# Patient Record
Sex: Female | Born: 1975 | Race: White | Hispanic: Yes | Marital: Married | Smoking: Never smoker
Health system: Southern US, Community
[De-identification: ages and names within clinical notes are randomized; demographics above are authoritative.]

## PROBLEM LIST (undated history)

## (undated) DIAGNOSIS — F32A Depression, unspecified: Secondary | ICD-10-CM

## (undated) DIAGNOSIS — F329 Major depressive disorder, single episode, unspecified: Secondary | ICD-10-CM

## (undated) DIAGNOSIS — E785 Hyperlipidemia, unspecified: Secondary | ICD-10-CM

## (undated) DIAGNOSIS — A048 Other specified bacterial intestinal infections: Secondary | ICD-10-CM

## (undated) DIAGNOSIS — I10 Essential (primary) hypertension: Secondary | ICD-10-CM

## (undated) HISTORY — DX: Major depressive disorder, single episode, unspecified: F32.9

## (undated) HISTORY — DX: Essential (primary) hypertension: I10

## (undated) HISTORY — DX: Depression, unspecified: F32.A

## (undated) HISTORY — DX: Morbid (severe) obesity due to excess calories: E66.01

## (undated) HISTORY — DX: Other specified bacterial intestinal infections: A04.8

## (undated) HISTORY — DX: Hyperlipidemia, unspecified: E78.5

---

## 2002-06-02 ENCOUNTER — Other Ambulatory Visit: Admission: RE | Admit: 2002-06-02 | Discharge: 2002-06-02 | Payer: Self-pay | Admitting: Obstetrics and Gynecology

## 2002-12-22 ENCOUNTER — Inpatient Hospital Stay (HOSPITAL_COMMUNITY): Admission: AD | Admit: 2002-12-22 | Discharge: 2002-12-26 | Payer: Self-pay | Admitting: Obstetrics and Gynecology

## 2002-12-23 ENCOUNTER — Encounter (INDEPENDENT_AMBULATORY_CARE_PROVIDER_SITE_OTHER): Payer: Self-pay | Admitting: Specialist

## 2006-09-10 DIAGNOSIS — O009 Unspecified ectopic pregnancy without intrauterine pregnancy: Secondary | ICD-10-CM

## 2006-09-20 ENCOUNTER — Inpatient Hospital Stay (HOSPITAL_COMMUNITY): Admission: AD | Admit: 2006-09-20 | Discharge: 2006-09-20 | Payer: Self-pay | Admitting: Obstetrics & Gynecology

## 2006-09-23 ENCOUNTER — Inpatient Hospital Stay (HOSPITAL_COMMUNITY): Admission: AD | Admit: 2006-09-23 | Discharge: 2006-09-23 | Payer: Self-pay | Admitting: Obstetrics & Gynecology

## 2006-09-26 ENCOUNTER — Inpatient Hospital Stay (HOSPITAL_COMMUNITY): Admission: AD | Admit: 2006-09-26 | Discharge: 2006-09-26 | Payer: Self-pay | Admitting: Obstetrics & Gynecology

## 2007-10-23 ENCOUNTER — Other Ambulatory Visit: Admission: RE | Admit: 2007-10-23 | Discharge: 2007-10-23 | Payer: Self-pay | Admitting: Internal Medicine

## 2007-10-23 ENCOUNTER — Encounter: Payer: Self-pay | Admitting: Internal Medicine

## 2007-10-23 ENCOUNTER — Ambulatory Visit: Payer: Self-pay | Admitting: Internal Medicine

## 2007-10-23 DIAGNOSIS — N926 Irregular menstruation, unspecified: Secondary | ICD-10-CM

## 2007-10-24 LAB — CONVERTED CEMR LAB
ALT: 15 units/L (ref 0–35)
Calcium: 9.4 mg/dL (ref 8.4–10.5)
Chloride: 106 meq/L (ref 96–112)
Cholesterol: 202 mg/dL (ref 0–200)
Creatinine, Ser: 0.5 mg/dL (ref 0.4–1.2)
Direct LDL: 133.7 mg/dL
Eosinophils Absolute: 0.2 10*3/uL (ref 0.0–0.6)
Eosinophils Relative: 2.1 % (ref 0.0–5.0)
GFR calc non Af Amer: 153 mL/min
Glucose, Bld: 88 mg/dL (ref 70–99)
HCT: 38.4 % (ref 36.0–46.0)
Iron: 55 ug/dL (ref 42–145)
MCV: 87.6 fL (ref 78.0–100.0)
Neutrophils Relative %: 65.6 % (ref 43.0–77.0)
Pap Smear: NORMAL
Platelets: 304 10*3/uL (ref 150–400)
RBC: 4.38 M/uL (ref 3.87–5.11)
RDW: 12 % (ref 11.5–14.6)
WBC: 7.2 10*3/uL (ref 4.5–10.5)

## 2007-10-31 ENCOUNTER — Encounter (INDEPENDENT_AMBULATORY_CARE_PROVIDER_SITE_OTHER): Payer: Self-pay | Admitting: *Deleted

## 2008-12-31 ENCOUNTER — Encounter: Payer: Self-pay | Admitting: Gynecology

## 2008-12-31 ENCOUNTER — Other Ambulatory Visit: Admission: RE | Admit: 2008-12-31 | Discharge: 2008-12-31 | Payer: Self-pay | Admitting: Internal Medicine

## 2008-12-31 ENCOUNTER — Ambulatory Visit: Payer: Self-pay | Admitting: Gynecology

## 2010-01-24 ENCOUNTER — Other Ambulatory Visit: Admission: RE | Admit: 2010-01-24 | Discharge: 2010-01-24 | Payer: Self-pay | Admitting: Gynecology

## 2010-01-24 ENCOUNTER — Ambulatory Visit: Payer: Self-pay | Admitting: Gynecology

## 2010-01-27 ENCOUNTER — Ambulatory Visit: Payer: Self-pay | Admitting: Gynecology

## 2010-01-31 ENCOUNTER — Ambulatory Visit (HOSPITAL_COMMUNITY): Admission: RE | Admit: 2010-01-31 | Discharge: 2010-01-31 | Payer: Self-pay | Admitting: Gynecology

## 2010-01-31 ENCOUNTER — Ambulatory Visit: Payer: Self-pay | Admitting: Gynecology

## 2010-02-03 ENCOUNTER — Inpatient Hospital Stay (HOSPITAL_COMMUNITY): Admission: AD | Admit: 2010-02-03 | Discharge: 2010-02-03 | Payer: Self-pay | Admitting: Gynecology

## 2010-02-06 ENCOUNTER — Inpatient Hospital Stay (HOSPITAL_COMMUNITY): Admission: AD | Admit: 2010-02-06 | Discharge: 2010-02-06 | Payer: Self-pay | Admitting: Gynecology

## 2010-02-07 ENCOUNTER — Ambulatory Visit: Payer: Self-pay | Admitting: Gynecology

## 2010-02-21 ENCOUNTER — Ambulatory Visit: Payer: Self-pay | Admitting: Gynecology

## 2010-02-28 ENCOUNTER — Ambulatory Visit: Payer: Self-pay | Admitting: Gynecology

## 2010-03-07 ENCOUNTER — Ambulatory Visit: Payer: Self-pay | Admitting: Gynecology

## 2010-12-11 DIAGNOSIS — I10 Essential (primary) hypertension: Secondary | ICD-10-CM

## 2010-12-11 HISTORY — DX: Essential (primary) hypertension: I10

## 2011-01-01 ENCOUNTER — Encounter: Payer: Self-pay | Admitting: Gynecology

## 2011-01-27 ENCOUNTER — Encounter (INDEPENDENT_AMBULATORY_CARE_PROVIDER_SITE_OTHER): Payer: 59 | Admitting: Gynecology

## 2011-01-27 ENCOUNTER — Other Ambulatory Visit (HOSPITAL_COMMUNITY)
Admission: RE | Admit: 2011-01-27 | Discharge: 2011-01-27 | Disposition: A | Discharge status: Home / Self Care | Payer: 59 | Source: Ambulatory Visit | Attending: Gynecology | Admitting: Gynecology

## 2011-01-27 ENCOUNTER — Other Ambulatory Visit: Payer: Self-pay | Admitting: Gynecology

## 2011-01-27 DIAGNOSIS — Z124 Encounter for screening for malignant neoplasm of cervix: Secondary | ICD-10-CM | POA: Insufficient documentation

## 2011-01-27 DIAGNOSIS — B373 Candidiasis of vulva and vagina: Secondary | ICD-10-CM

## 2011-01-27 DIAGNOSIS — Z13220 Encounter for screening for lipoid disorders: Secondary | ICD-10-CM

## 2011-01-27 DIAGNOSIS — Z833 Family history of diabetes mellitus: Secondary | ICD-10-CM

## 2011-01-27 DIAGNOSIS — R635 Abnormal weight gain: Secondary | ICD-10-CM

## 2011-01-27 DIAGNOSIS — Z01419 Encounter for gynecological examination (general) (routine) without abnormal findings: Secondary | ICD-10-CM

## 2011-02-03 ENCOUNTER — Other Ambulatory Visit (INDEPENDENT_AMBULATORY_CARE_PROVIDER_SITE_OTHER): Payer: 59

## 2011-02-03 DIAGNOSIS — E7800 Pure hypercholesterolemia, unspecified: Secondary | ICD-10-CM

## 2011-03-01 LAB — DIFFERENTIAL
Basophils Absolute: 0 10*3/uL (ref 0.0–0.1)
Basophils Relative: 0 % (ref 0–1)
Eosinophils Absolute: 0.1 10*3/uL (ref 0.0–0.7)
Lymphs Abs: 2.1 10*3/uL (ref 0.7–4.0)
Monocytes Absolute: 0.4 10*3/uL (ref 0.1–1.0)
Monocytes Relative: 4 % (ref 3–12)
Monocytes Relative: 5 % (ref 3–12)
Neutro Abs: 6.6 10*3/uL (ref 1.7–7.7)
Neutrophils Relative %: 58 % (ref 43–77)
Neutrophils Relative %: 71 % (ref 43–77)

## 2011-03-01 LAB — HCG, QUANTITATIVE, PREGNANCY
hCG, Beta Chain, Quant, S: 4198 m[IU]/mL — ABNORMAL HIGH (ref <5)
hCG, Beta Chain, Quant, S: 5638 m[IU]/mL — ABNORMAL HIGH (ref <5)

## 2011-03-01 LAB — CBC
HCT: 40.2 % (ref 36.0–46.0)
MCHC: 33.7 g/dL (ref 30.0–36.0)
MCV: 89.3 fL (ref 78.0–100.0)
MCV: 89.4 fL (ref 78.0–100.0)
Platelets: 264 10*3/uL (ref 150–400)
RBC: 4.5 MIL/uL (ref 3.87–5.11)
WBC: 5.4 10*3/uL (ref 4.0–10.5)
WBC: 9.2 10*3/uL (ref 4.0–10.5)

## 2011-03-01 LAB — CREATININE, SERUM
Creatinine, Ser: 0.47 mg/dL (ref 0.4–1.2)
GFR calc Af Amer: >60 mL/min (ref >60)
GFR calc non Af Amer: >60 mL/min (ref >60)

## 2011-03-01 LAB — AST: AST: 12 U/L (ref 0–37)

## 2011-03-01 LAB — BUN: BUN: 7 mg/dL (ref 6–23)

## 2011-03-02 ENCOUNTER — Other Ambulatory Visit: Payer: 59

## 2011-04-28 NOTE — Discharge Summary (Signed)
   Crystal Colon, LINGELBACH NO.:  [id number]   MEDICAL RECORD NO.:  [id number]                   PATIENT TYPE:  INP   LOCATION:  9127                                 [facility]   PHYSICIAN:  Conni Elliot, M.D.             DATE OF BIRTH:  1976-05-22   DATE OF ADMISSION:  12/22/2002  DATE OF DISCHARGE:  12/26/2002                                 DISCHARGE SUMMARY   DISCHARGE DIAGNOSES:  Repeat low transverse cesarean section of a viable  female infant.   DISCHARGE MEDICATIONS:  1. Percocet 5/325 mg one to two tablets p.o. q.4h. p.r.n. pain.  2. Ferrous sulfate 325 mg one tablet p.o. daily.  3. Prenatal vitamins one tablet p.o. daily.   BRIEF ADMISSION HISTORY:  This is a 35 year old G2, P1, now G2, P2 who  presented at 9 and 4/[redacted] weeks gestation with spontaneous rupture of  membranes and wishes to pursue VBAC.  She had had a scheduled cesarean  section at 40 weeks for her first gestation secondary to breech  presentation.  Upon admission she was found to have rupture of membranes,  then was consequently admitted to L&D.  Expectant management was performed  __________.  However, secondary to slow progression, low dose Pitocin and an  IUPC was started on her.  During labor she did spike a temperature of 100.8  and consequently was started on ampicillin and gentamicin for  chorioamnionitis.  The patient's labor progressed until she was an anterior  lip, 100% effaced, and +1 to +2 station.  However, she remained anterior lip  for over an hour and the fetal heart tracing became nonreassuring.  She had  decreased short-term variability and was having repetitive late  decelerations.  Consequently, at that point in time she was taken to the OR  for a repeat LTCS.  At that time she delivered a viable female infant with  Apgars of 4 at one minute and 9 at five minutes.  She then had routine  postpartum care and was discharged on postoperative day number three  with  good incisional healing.   DISCHARGE ACTIVITIES:  The patient was advised to pelvic rest consistent of  no sexual activity for six weeks.   FOLLOW UP:  She was advised to follow up at [facility] in six weeks for  which an IUD will be placed.   DISCHARGE LABORATORIES:  Her hemoglobin was 9.1 and hematocrit was 26.5 on  December 24, 2002.  Newborn information and circumcision was performed on the  patient's child on December 25, 2002.     Rodolph Bong, M.D.                        Conni Elliot, M.D.    AK/MEDQ  D:  12/26/2002  T:  12/26/2002  Job:  [id number]

## 2011-04-28 NOTE — Op Note (Signed)
   [name], Colon NO.:  [id number]   MEDICAL RECORD NO.:  [id number]                   PATIENT TYPE:  INP   LOCATION:  9127                                 [facility]   PHYSICIAN:  Conni Elliot, M.D.             DATE OF BIRTH:  19-Apr-1976   DATE OF PROCEDURE:  12/23/2002  DATE OF DISCHARGE:  12/26/2002                                 OPERATIVE REPORT   PREOPERATIVE DIAGNOSIS:  1. Repetitive late decelerations and decreased short term variability.  2. Prior cesarean delivery.  3. Persistent occipitoposterior position.   POSTOPERATIVE DIAGNOSES:  1. Repetitive late decelerations and decreased short term variability.  2. Prior cesarean delivery.  3. Persistent occipitoposterior position.   PROCEDURE:  Low transverse cesarean section.   SURGEON:  Conni Elliot, M.D.   OPERATIVE FINDINGS:  7 pound 14 ounce female with Apgars of 4 and 9, unable to  obtain adequate umbilical vein gas.  The placenta was sent to pathology.   DESCRIPTION OF PROCEDURE:  The patient was taken to the operating room and  placed supine in the left lateral tilt position and receiving oxygen.  She  was prepped and draped in the sterile fashion.  A low transverse incision  was made.  Incision was extended through the skin and subcutaneous fascia.  The rectus muscles were opened in the midline.  I created a bladder flap and  a low posterior incision was made.  The baby was delivered in the vertex  presentation.  Cord was doubly clamped and cut.  The baby was handed to the  neonatologist in attendance.  The placenta was delivered spontaneously.  The  uterus, bladder flap, anterior peritoneum, fascia and skin were closed in  the routine fashion.  Estimated blood loss was less than 800 mL.  The  needle, sponge and instrument counts were correct.   ANESTHESIA:   INDICATIONS:   DESCRIPTION OF PROCEDURE:                                               Conni Elliot, M.D.    ASG/MEDQ  D:  02/14/2003  T:  02/14/2003  Job:  [id number]

## 2012-01-29 ENCOUNTER — Encounter: Payer: Self-pay | Admitting: Gynecology

## 2012-01-29 ENCOUNTER — Ambulatory Visit (INDEPENDENT_AMBULATORY_CARE_PROVIDER_SITE_OTHER): Payer: 59 | Admitting: Gynecology

## 2012-01-29 VITALS — BP 122/80 | Ht 62.0 in | Wt 185.0 lb

## 2012-01-29 DIAGNOSIS — R635 Abnormal weight gain: Secondary | ICD-10-CM

## 2012-01-29 DIAGNOSIS — Z01419 Encounter for gynecological examination (general) (routine) without abnormal findings: Secondary | ICD-10-CM

## 2012-01-29 DIAGNOSIS — G8929 Other chronic pain: Secondary | ICD-10-CM | POA: Insufficient documentation

## 2012-01-29 DIAGNOSIS — R1013 Epigastric pain: Secondary | ICD-10-CM

## 2012-01-29 DIAGNOSIS — I10 Essential (primary) hypertension: Secondary | ICD-10-CM | POA: Insufficient documentation

## 2012-01-29 LAB — COMPREHENSIVE METABOLIC PANEL
AST: 14 U/L (ref 0–37)
Alkaline Phosphatase: 60 U/L (ref 39–117)
BUN: 11 mg/dL (ref 6–23)
Calcium: 8.8 mg/dL (ref 8.4–10.5)
Creat: 0.62 mg/dL (ref 0.50–1.10)
Total Bilirubin: 0.7 mg/dL (ref 0.3–1.2)

## 2012-01-29 LAB — LIPID PANEL
HDL: 58 mg/dL (ref >39)
LDL Cholesterol: 115 mg/dL — ABNORMAL HIGH (ref 0–99)
Total CHOL/HDL Ratio: 3.4 Ratio
VLDL: 27 mg/dL (ref 0–40)

## 2012-01-29 LAB — CBC WITH DIFFERENTIAL/PLATELET
Basophils Absolute: 0.1 10*3/uL (ref 0.0–0.1)
Basophils Relative: 1 % (ref 0–1)
Hemoglobin: 13.9 g/dL (ref 12.0–15.0)
MCHC: 32.6 g/dL (ref 30.0–36.0)
Monocytes Relative: 6 % (ref 3–12)
Neutro Abs: 2.9 10*3/uL (ref 1.7–7.7)
Neutrophils Relative %: 55 % (ref 43–77)
RDW: 13.7 % (ref 11.5–15.5)

## 2012-01-29 LAB — URINALYSIS W MICROSCOPIC + REFLEX CULTURE
Casts: NONE SEEN
Ketones, ur: NEGATIVE mg/dL
Nitrite: NEGATIVE
pH: 5.5 (ref 5.0–8.0)

## 2012-01-29 LAB — TSH: TSH: 0.709 u[IU]/mL (ref 0.350–4.500)

## 2012-01-29 MED ORDER — LISINOPRIL 10 MG PO TABS: 20.0000 mg | ORAL_TABLET | Freq: Every day | ORAL | Status: DC

## 2012-01-29 NOTE — Patient Instructions (Addendum)
Control del colesterol  Los niveles de colesterol en el organismo estn determinados significativamente por su dieta. Los niveles de colesterol tambin se relacionan con la enfermedad cardaca. El material que sigue ayuda a Software engineer relacin y a Chiropractor qu puede hacer para mantener su corazn sano. No todo el colesterol es [place]. Las lipoprotenas de baja densidad (LDL) forman el colesterol "malo". El colesterol malo puede ocasionar depsitos de grasa que se acumulan en el interior de las arterias. Las lipoprotenas de alta densidad (HDL) es el colesterol "bueno". Ayuda a remover el colesterol LDL "malo" de la [place]. El colesterol es un factor de riesgo muy importante para la enfermedad cardaca. Otros factores de riesgo son la hipertensin arterial, el hbito de fumar, el estrs, la herencia y [place].  El msculo cardaco obtiene el suministro de sangre a travs de las arterias coronarias. Si su colesterol LDL ("malo") est elevado y el HDL ("bueno") es bajo, tiene un factor de riesgo para que se formen depsitos de Holiday representative en las arterias coronarias (los vasos sanguneos que suministran sangre al corazn). Esto hace que haya menos lugar para que la sangre circule. Sin la suficiente sangre y oxgeno, el msculo cardaco no puede funcionar correctamente, y usted podr sentir dolores en el pecho (angina pectoris). Cuando una arteria coronaria se cierra completamente, una parte del msculo cardaco puede morir (infarto de miocardio). CONTROL DEL COLESTEROL Cuando el profesional que lo asiste enva la sangre al laboratorio para Artist nivel de colesterol, puede realizarle tambin un perfil completo de los lpidos. Con esta prueba, se puede determinar la cantidad total de colesterol, as como los niveles de LDL y HDL. Los triglicridos son un tipo de grasa que circula en la sangre y que tambin puede utilizarse para determinar el riesgo de enfermedad  cardaca. En la siguiente tabla se establecen los nmeros ideales: Prueba: Colesterol total  Menos de 200 mg/dl.  Prueba: LDL "colesterol malo"  Menos de 100 mg/dl.   Menos de 70 mg/dl si tiene riesgo muy elevado de sufrir un ataque cardaco o muerte cardaca sbita.  Prueba: HDL "colesterol bueno"  Mujeres: Ms de 50 mg/dl.   Hombres: Ms de 40 mg/dl.  Prueba: Trigliceridos  Menos de 150 mg/dl.  CONTROL DEL COLESTEROL CON DIETA Aunque factores como el ejercicio y el estilo de vida son importantes, la "primera lnea de ataque" es la dieta. Esto se debe a que se sabe que ciertos alimentos hacen subir el colesterol y otros lo [place]. El objetivo debe ser [employer] alimentos, de modo que tengan un efecto sobre el colesterol y, an ms importante, Microbiologist las grasas saturadas y trans con otros tipos de grasas, como las monoinsaturadas y las poliinsaturadas y cidos grasos omega-3 . En promedio, una persona no debe consumir ms de 15 a 17 g de grasas saturadas por [employer]. Las grasas saturadas y trans se consideran grasas "malas", ya que elevan el colesterol LDL. Las grasas saturadas se encuentran principalmente en productos animales como carne, Miles y crema. Pero esto no significa que usted Marketing executive todas sus comidas favoritas. Actualmente, como lo muestra el cuadro que figura al final de este documento, hay sustitutos de buen sabor, bajos en grasas y en colesterol, para la mayora de los alimentos que a usted Musician. Elija aquellos alimentos alternativos que sean bajos en grasas o sin grasas. Elija cortes de carne del cuarto trasero o lomo ya que estos cortes son los que tienen menor cantidad de grasa y Oncologist. El pollo (  sin piel), el pescado, la carne de ternera, y la [place] de [place] molida son excelentes opciones. Elimine las carnes [employer] o el salami. Los [employer] o nada de grasas saturadas. Cuando consuma carne [place], carne de aves de  corral, o pescado, hgalo en porciones de 85 gramos (3 onzas). Las grasas trans tambin se llaman "aceites parcialmente hidrogenados". Son aceites manipulados cientficamente de [place] que son slidos a Publishing rights manager, tienen una larga vida y Glass blower/designer sabor y la textura de los alimentos a los que se Scientist, clinical (histocompatibility and immunogenetics). Las grasas trans se encuentran en la [place], [place], crackers y alimentos horneados.  Para hornear y cocinar, el aceite es un excelente sustituto para la [place]. Los aceites monoinsaturados tienen un beneficio particular, ya que se cree que disminuyen el colesterol LDL (colesterol malo) y elevan el HDL. Deber evitar los aceites tropicales saturados como el de coco y el de [place].  Recuerde, adems, que puede comer sin restricciones los grupos de alimentos que son naturalmente libres de grasas saturadas y Neurosurgeon trans, entre los que se incluyen el pescado, las frutas (excepto el [place]), verduras, frijoles, cereales (cebada, arroz, [place], trigo) y las pastas (sin salsas con crema)  IDENTIFIQUE LOS ALIMENTOS QUE DISMINUYEN EL COLESTEROL  Pueden disminuir el colesterol las fibras solubles que estn en las frutas, como las [place], en los vegetales como el brcoli, las patatas y las zanahorias; en las legumbres como frijoles, guisantes y Therapist, occupational; y en los cereales como la cebada. Los alimentos fortificados con fitosteroles tambin Engineer, production. Debe consumir al menos 2 g de estos alimentos a diario para Financial planner de disminucin de [place].  En el supermercado, lea las etiquetas de los envases para identificar los alimentos bajos en grasas saturadas, libres de grasas trans y bajos en [place], . Elija quesos que tengan solo de 2 a 3 g de grasa saturada por onza (28,35 g). Use una margarina que no dae el corazn, [place] de grasas trans o aceite parcialmente hidrogenado. Al comprar alimentos horneados (galletitas dulces y Gaffer) evite el aceite parcialmente  hidrogenado. Los panes y bollos debern ser de granos enteros (harina de maz o de avena entera, en lugar de "harina" o "harina enriquecida"). Compre sopas en lata que no sean cremosas, con bajo contenido de sal y sin grasas adicionadas.  TCNICAS DE PREPARACIN DE LOS ALIMENTOS  Nunca fra los alimentos en aceite abundante. Si debe frer, hgalo en poco aceite y removiendo [place], porque as se utilizan muy pocas grasas, o utilice un spray antiadherente. Cuando le sea posible, hierva, hornee o ase las carnes y cocine los vegetales al vapor. En vez de [employer] con mantequilla o [place], utilice limn y hierbas, pur de Psychologist, educational y canela (para las calabazas y batatas), yogurt y salsa descremados y aderezos para ensaladas bajos en contenido graso.  BAJO EN GRASAS SATURADAS / SUSTITUTOS BAJOS EN GRASA  Carnes / Grasas saturadas (g)  Evite: Bife, corte graso (3 oz/85 g) / 11 g   Elija: Bife, corte magro (3 oz/85 g) / 4 g   Evite: Hamburguesa (3 oz/85 g) / 7 g   Elija:  [place] magra (3 oz/85 g) / 5 g   Evite: Jamn (3 oz/85 g) / 6 g   Elija:  Jamn magro (3 oz/85 g) / 2.4 g   Evite: Pollo, con piel (3 oz/85 g), Carne oscura / 4 g   Elija:  Pollo, sin piel (3 oz/85 g), Carne oscura / 2  g   Evite: Pollo, con piel (3 oz/85 g), Carne magra / 2.5 g   Elija: Pollo, sin piel (3 oz/85 g), Carne magra / 1 g  Lcteos / Grasas saturadas (g)  Evite: Leche entera (1 taza) / 5 g   Elija: Leche con bajo contenido de grasa, 2% (1 taza) / 3 g   Elija: Leche con bajo contenido de grasa, 1% (1 taza) / 1.5 g   Elija: Leche descremada (1 taza) / 0.3 g   Evite: Queso duro (1 oz/28 g) / 6 g   Elija: Queso descremado (1 oz/28 g) / 2-3 g   Evite: Queso cottage, 4% grasa (1 taza)/ 6.5 g   Elija: Queso cottage con bajo contenido de grasa, 1% grasa (1 taza)/ 1.5 g   Evite: Helado (1 taza) / 9 g   Elija: Sorbete (1 taza) / 2.5 g   Elija: Yogurt helado sin contenido de grasa  (1 taza) / 0.3 g   Elija: Barras de fruta congeladas / vestigios   Evite: Crema batida (1 cucharada) / 3.5 g   Elija: Batidos glac sin lcteos (1 cucharada) / 1 g  Condimentos / Grasas saturadas (g)  Evite: Mayonesa (1 cucharada) / 2 g   Elija: Mayonesa con bajo contenido de grasa (1 cucharada) / 1 g   Evite: [place] (1 cucharada) / 7 g   Elija: Margarina extra light (1 cucharada) / 1 g   Evite: Aceite de coco (1 cucharada) / 11.8 g   Elija: Aceite de oliva (1 cucharada) / 1.8 g   Elija: Aceite de maz (1 cucharada) / 1.7 g   Elija: Aceite de crtamo (1 cucharada) / 1.2 g   Elija: Aceite de girasol (1 cucharada) / 1.4 g   Elija: Aceite de soja (1 cucharada) / 2.4 g   Elija: Aceite de canola (1 cucharada) / 1 g  Document Released: 11/27/2005 Document Revised: 08/09/2011 [facility] Patient Information 2012 [place], [place].  Ejercicios para perder peso (Exercise to Lose Weight) La actividad fsica y [place]omia Dear dieta saludable ayudan a perder peso. El mdico podr sugerirle ejercicios especficos. IDEAS Y CONSEJOS PARA HACER EJERCICIOS  Elija opciones econmicas que disfrute hacer , como caminar, andar en bicicleta o los vdeos para ejercitarse.   Utilice las Microbiologist del ascensor.   Camine durante la hora del almuerzo.   Estacione el auto lejos del lugar de [place] o [place].   Concurra a un gimnasio o tome clases de gimnasia.   Comience con 5  10 minutos de actividad fsica por da. Ejercite hasta 30 minutos, 4 a 6 das por [address]   Utilice zapatos que tengan un buen soporte y ropas cmodas.   Elongue antes y despus de Company secretary.   Ejercite hasta que aumente la respiracin y el corazn palpite rpido.   Beba agua extra cuando ejercite.   No haga ejercicio Firefighter, sentirse mareado o que le falte mucho el aire.  La actividad fsica puede quemar alrededor de 150 caloras.  Correr 20 cuadras en 15 minutos.   Jugar vley durante 45 a 60 minutos.     Limpiar y encerar el auto durante 45 a 60 minutos.   Jugar ftbol americano de toque.   Caminar 25 cuadras en 35 minutos.   Empujar un cochecito 20 cuadras en 30 minutos.   Jugar baloncesto durante 30 minutos.   Rastrillar hojas secas durante 30 minutos.   Andar en bicicleta 80 cuadras en 30 minutos.   Caminar 30 cuadras en  30 minutos.   Bailar durante 30 minutos.   Quitar la nieve con una pala durante 15 minutos.   Nadar vigorosamente durante 20 minutos.   Subir escaleras durante 15 minutos.   Andar en bicicleta 60 cuadras durante 15 minutos.   Arreglar el jardn entre 30 y 45 minutos.   Saltar a la soga durante 15 minutos.   Limpiar vidrios o pisos durante 45 a 60 minutos.  Document Released: 03/03/2011 Document Revised: 08/09/2011 [employer] llo   Caltrate o, Oscal o Citracal una diaria

## 2012-01-29 NOTE — Progress Notes (Signed)
Crystal Colon [birth year] [id number]   History:    36 y.o.  for annual exam with complaints of midepigastric pain regardless of fluid intake. She also states that she has metallic lactase in her mouth also. Patient with prior history of 2 ectopic pregnancies in the past. Husband has had a vasectomy and she is no longer oral contraceptive pills. And her cycles are regular. Her primary physician to place her on lisinopril for hypertension for which she ran out of needed refill today. No blood were drawn since last year.  Past medical history,surgical history, family history and social history were all reviewed and documented in the EPIC chart.  Gynecologic History Patient's last menstrual period was 01/20/2012. Contraception: vasectomy Last Pap: 2012. Results were: normal Last mammogram: No prior study. Results were: No prior study  Obstetric History OB History    Grav Para Term Preterm Abortions TAB SAB Ect Mult Living   4 2 2  2   2  2      # Outc Date GA Lbr Len/2nd Wgt Sex Del Anes PTL Lv   1 TRM     F CS  No Yes   2 ECT            3 ECT            4 TRM     M CS  No Yes       ROS:  Was performed and pertinent positives and negatives are included in the history.  Exam: chaperone present  BP 122/80  Ht 5\' 2"  (1.575 m)  Wt 185 lb (83.915 kg)  BMI 33.84 kg/m2  LMP 01/20/2012  Body mass index is 33.84 kg/(m^2).  General appearance : Well developed well nourished female. No acute distress HEENT: Neck supple, trachea midline, no carotid bruits, no thyroidmegaly Lungs: Clear to auscultation, no rhonchi or wheezes, or rib retractions  Heart: Regular rate and rhythm, no murmurs or gallops Breast:Examined in sitting and supine position were symmetrical in appearance, no palpable masses or tenderness,  no skin retraction, no nipple inversion, no nipple discharge, no skin discoloration, no axillary or supraclavicular lymphadenopathy Abdomen: no palpable masses or tenderness, no  rebound or guarding Extremities: no edema or skin discoloration or tenderness  Pelvic:  Bartholin, Urethra, Skene Glands: Within normal limits             Vagina: No gross lesions or discharge  Cervix: No gross lesions or discharge  Uterus  anteverted, normal size, shape and consistency, non-tender and mobile  Adnexa  Without masses or tenderness  Anus and perineum  normal   Rectovaginal  normal sphincter tone without palpated masses or tenderness             Hemoccult not done     Assessment/Plan:  36 y.o. female for annual exam with complaint of persistent midepigastric pains regardless of fluid intake. Also she's had history of constipation the past that she would go as much as one week without bowel movement. She states that she is going regularly now. She denies any hematochezia. We'll) for her to see the gastroenterologist in consultation after we obtained the following test: Upper abdominal ultrasound, conference metabolic panel, H. pylori, CBC, TSH, fasting lipid profile, and urinalysis. New screening guidelines her Pap smear and discussed she'll not have a Pap smear today but in 2 years. She was instructed to take her calcium vitamin D for osteoporosis prevention. Instruction sheet provided on weight reduction and on exercise and diet.  Ok Edwards MD, 11:20 AM 01/29/2012

## 2012-01-30 ENCOUNTER — Encounter (INDEPENDENT_AMBULATORY_CARE_PROVIDER_SITE_OTHER): Payer: Self-pay | Admitting: Gynecology

## 2012-01-30 LAB — H. PYLORI ANTIBODY, IGG: H Pylori IgG: >8 {ISR} — ABNORMAL HIGH

## 2012-01-30 MED ORDER — CLARITHROMYCIN 500 MG PO TABS: 500.0000 mg | ORAL_TABLET | Freq: Two times a day (BID) | ORAL | Status: AC

## 2012-01-30 MED ORDER — PANTOPRAZOLE SODIUM 20 MG PO TBEC: DELAYED_RELEASE_TABLET | ORAL | Status: DC

## 2012-01-30 MED ORDER — AMOXICILLIN 500 MG PO CAPS: ORAL_CAPSULE | ORAL | Status: DC

## 2012-01-31 ENCOUNTER — Other Ambulatory Visit: Payer: Self-pay | Admitting: Gynecology

## 2012-01-31 LAB — URINE CULTURE

## 2012-01-31 NOTE — Progress Notes (Signed)
Error patient not seen

## 2012-02-01 ENCOUNTER — Telehealth: Payer: Self-pay | Admitting: *Deleted

## 2012-02-01 ENCOUNTER — Other Ambulatory Visit: Payer: Self-pay | Admitting: Anesthesiology

## 2012-02-01 NOTE — Telephone Encounter (Signed)
Message copied by Libby Maw on Thu Feb 01, 2012  2:37 PM ------      Message from: Ok Edwards      Created: Mon Jan 29, 2012 11:25 AM        Claudean Leavelle, please schedule an upper abdominal ultrasound for this patient with persistent midepigastric pains regardless of meals. Lab work ordered today. I would need for her to see Dr. Elnoria Howard 1-2 weeks after the upper abdominal ultrasound. Thank you

## 2012-02-01 NOTE — Telephone Encounter (Signed)
Elane Fritz CMA spoke to husband and gave appt info.  Ultrasound set up at [facility] 02/05/12 @ 8am and Dr. Elnoria Howard appt set up for 02/14/12 9am.  Will send records to Dr. Elnoria Howard when report is available.

## 2012-02-02 ENCOUNTER — Ambulatory Visit: Payer: 59

## 2012-02-02 DIAGNOSIS — R1013 Epigastric pain: Secondary | ICD-10-CM

## 2012-02-02 DIAGNOSIS — N926 Irregular menstruation, unspecified: Secondary | ICD-10-CM

## 2012-02-02 DIAGNOSIS — R310 Gross hematuria: Secondary | ICD-10-CM

## 2012-02-03 LAB — URINALYSIS W MICROSCOPIC + REFLEX CULTURE
Crystals: NONE SEEN
Nitrite: NEGATIVE
Specific Gravity, Urine: 1.014 (ref 1.005–1.030)
Urobilinogen, UA: 0.2 mg/dL (ref 0.0–1.0)
pH: 6 (ref 5.0–8.0)

## 2012-02-05 ENCOUNTER — Ambulatory Visit (HOSPITAL_COMMUNITY)
Admission: RE | Admit: 2012-02-05 | Discharge: 2012-02-05 | Disposition: A | Discharge status: Home / Self Care | Payer: 59 | Source: Ambulatory Visit | Attending: Gynecology | Admitting: Gynecology

## 2012-02-05 DIAGNOSIS — R635 Abnormal weight gain: Secondary | ICD-10-CM | POA: Insufficient documentation

## 2012-02-05 DIAGNOSIS — R1013 Epigastric pain: Secondary | ICD-10-CM | POA: Insufficient documentation

## 2012-02-05 DIAGNOSIS — I10 Essential (primary) hypertension: Secondary | ICD-10-CM | POA: Insufficient documentation

## 2012-02-05 DIAGNOSIS — Z01419 Encounter for gynecological examination (general) (routine) without abnormal findings: Secondary | ICD-10-CM

## 2012-02-05 LAB — URINE CULTURE

## 2012-02-13 MED ORDER — NITROFURANTOIN MONOHYD MACRO 100 MG PO CAPS: 100.0000 mg | ORAL_CAPSULE | Freq: Two times a day (BID) | ORAL | Status: AC

## 2012-02-13 NOTE — Progress Notes (Signed)
RX CALLED TO PHARMACY. SINCE UNABLE TO ROUTE TO [facility] ELECTRONICALLY.

## 2012-03-22 ENCOUNTER — Encounter: Payer: Self-pay | Admitting: Gynecology

## 2013-02-28 ENCOUNTER — Ambulatory Visit (INDEPENDENT_AMBULATORY_CARE_PROVIDER_SITE_OTHER): Payer: 59 | Admitting: Gynecology

## 2013-02-28 ENCOUNTER — Encounter: Payer: Self-pay | Admitting: Gynecology

## 2013-02-28 VITALS — BP 124/76 | Ht 62.0 in | Wt 195.0 lb

## 2013-02-28 DIAGNOSIS — Z01419 Encounter for gynecological examination (general) (routine) without abnormal findings: Secondary | ICD-10-CM

## 2013-02-28 LAB — CBC WITH DIFFERENTIAL/PLATELET
Basophils Absolute: 0 10*3/uL (ref 0.0–0.1)
Basophils Relative: 1 % (ref 0–1)
Eosinophils Relative: 2 % (ref 0–5)
HCT: 40.2 % (ref 36.0–46.0)
Lymphocytes Relative: 31 % (ref 12–46)
MCH: 29.4 pg (ref 26.0–34.0)
MCHC: 33.8 g/dL (ref 30.0–36.0)
MCV: 86.8 fL (ref 78.0–100.0)
Monocytes Absolute: 0.3 10*3/uL (ref 0.1–1.0)
RDW: 13.7 % (ref 11.5–15.5)

## 2013-02-28 LAB — LIPID PANEL
Cholesterol: 188 mg/dL (ref 0–200)
HDL: 54 mg/dL (ref >39)
Total CHOL/HDL Ratio: 3.5 Ratio
Triglycerides: 86 mg/dL (ref <150)

## 2013-02-28 NOTE — Progress Notes (Addendum)
Crystal Colon 09-13-1976 [id number]   History:    37 y.o.  for annual gyn exam for annual exam who has been complaining of midepigastric discomfort for the past 15 days. Review of her records indicated that she was diagnosed with H. Pylori last year and had been referred to the gastroenterologist Dr.Hung who had performed an upper endoscopy and was treated. She denies any hematochezia. She has gained 10 pounds is last year. Her primary physician Dr. Alwyn Ren had start her on lisinopril for hypertension. She has complained of lower extremity swelling especially after being on her feet for long periods of time. She denies any chest pain shortness of breath or tingling her upper extremities.  Patient with no past history of abnormal Pap smear. She is doing her monthly breast examination. She is having normal menstrual cycles. Her husband has had a vasectomy.  Past medical history,surgical history, family history and social history were all reviewed and documented in the EPIC chart.  Gynecologic History Patient's last menstrual period was 02/10/2013. Contraception: vasectomy Last Pap: 2012. Results were: normal Last mammogram: not indicated. Results were: normal  Obstetric History OB History   Grav Para Term Preterm Abortions TAB SAB Ect Mult Living   4 2 2  2   2  2      # Outc Date GA Lbr Len/2nd Wgt Sex Del Anes PTL Lv   1 TRM     F CS  No Yes   2 ECT            3 ECT            4 TRM     M CS  No Yes       ROS: A ROS was performed and pertinent positives and negatives are included in the history.  GENERAL: No fevers or chills. HEENT: No change in vision, no earache, sore throat or sinus congestion. NECK: No pain or stiffness. CARDIOVASCULAR: No chest pain or pressure. No palpitations. PULMONARY: No shortness of breath, cough or wheeze. GASTROINTESTINAL: midepigastric discomfort GENITOURINARY: No urinary frequency, urgency, hesitancy or dysuria. MUSCULOSKELETAL: No joint or muscle pain, no  back pain, no recent trauma. DERMATOLOGIC: No rash, no itching, no lesions. ENDOCRINE: No polyuria, polydipsia, no heat or cold intolerance. No recent change in weight. HEMATOLOGICAL: No anemia or easy bruising or bleeding. NEUROLOGIC: No headache, seizures, numbness, tingling or weakness. PSYCHIATRIC: No depression, no loss of interest in normal activity or change in sleep pattern.     Exam: chaperone present  BP 124/76  Ht 5\' 2"  (1.575 m)  Wt 195 lb (88.451 kg)  BMI 35.66 kg/m2  LMP 02/10/2013  Body mass index is 35.66 kg/(m^2).  General appearance : Well developed well nourished female. No acute distress HEENT: Neck supple, trachea midline, no carotid bruits, no thyroidmegaly Lungs: Clear to auscultation, no rhonchi or wheezes, or rib retractions  Heart: Regular rate and rhythm, no murmurs or gallops Breast:Examined in sitting and supine position were symmetrical in appearance, no palpable masses or tenderness,  no skin retraction, no nipple inversion, no nipple discharge, no skin discoloration, no axillary or supraclavicular lymphadenopathy Abdomen: patient was found to be tender in the mid epigastric region. Extremities: no edema or skin discoloration or tenderness  Pelvic:  Bartholin, Urethra, Skene Glands: Within normal limits             Vagina: No gross lesions or discharge  Cervix: No gross lesions or discharge  Uterus  anteverted, normal size, shape and consistency, non-tender  and mobile  Adnexa  Without masses or tenderness  Anus and perineum  normal   Rectovaginal  normal sphincter tone without palpated masses or tenderness             Hemoccult not indicated     Assessment/Plan:  37 y.o. female for annual exam with what appears to be exacerbation of her H. Pylori. I have recommended she followup with her gastroenterologist the next 2 days. She can take Zantac over-the-counter when necessary. No Pap smear done today. The new guidelines discussed. The following labs  ordered today: Fasting lipid profile, TSH, fasting blood sugar, urinalysis and CBC. We're going to notify her gastroenterologist so that they can get her in for an appointment soon. Patient with normal upper abdominal ultrasound in 2013.    Ok Edwards MD, 1:11 PM 02/28/2013

## 2013-02-28 NOTE — Patient Instructions (Signed)
Enfermedad por Helicobacter Pylori  (Helicobacter Pylori Disease)  Algunos pacientes que sufren úlceras estomacales o de duodeno no causadas por irritantes, sino por infección con un germen. El germen se denomina helicobacter pylori (H. pylori). Esta bacteria vive en la superficie del estómago y del intestino delgado. Causa irritación, dolor y úlceras. La úlcera es una perforación en la pared del estómago o del intestino delgado.  Pruebas especiales de sangre y de aliento pueden detectar si sufre este tipo de infección. Si le realizan una endoscopía, las pruebas se efectuarán con muestras tomadas del estómago. Luego del tratamiento deberá someterse a exámenes para comprobar la curación. Estos se realizarán aproximadamente luego de un mes de finalizado el tratamiento, o cuando el profesional que lo asiste lo indique.  La mayor parte de las infecciones pueden curarse con antibióticos. Los antibióticos son medicamentos que destruyen gérmenes como el H pylori. También pueden utilizarse medicamentos anti ulcerosos que bloquean la secreción ácida del estómago. El tratamiento durará el tiempo que su médico considere adecuado. Comuníquese con el profesional si necesita más información acerca del H pylori. También póngase en cotacto en caso que los síntomas empeoren durante o después del tratamiento.  No necesitará una dieta especial. Evite:  · El consumo de cigarrillos.  · La aspirina  · El ibuprofeno  · Otros medicamentos antiinflamatorios  El alcohol y las comidas muy condimentadas también empeorarán los síntomas. El mejor consejo es tratar de evitar todo lo que afecte al estómago.  SOLICITE ATENCIÓN MÉDICA DE INMEDIATO SI OBSERVA:  · Dolor de estómago repentino, persistente y agudo.  · Vómitos sanguinolentos o que parecen borra de café.  · Materia fecal con sangre o de color negro.  · Sensación de mareo, desmayo o se siente débil y sudoroso.  Document Released: 11/27/2005 Document Revised: 02/19/2012  ExitCare® Patient  Information ©2013 ExitCare, LLC.

## 2013-03-01 LAB — URINALYSIS W MICROSCOPIC + REFLEX CULTURE
Bilirubin Urine: NEGATIVE
Casts: NONE SEEN
Crystals: NONE SEEN
Hgb urine dipstick: NEGATIVE
Ketones, ur: NEGATIVE mg/dL
Specific Gravity, Urine: 1.017 (ref 1.005–1.030)
pH: 7.5 (ref 5.0–8.0)

## 2014-04-03 ENCOUNTER — Encounter: Payer: 59 | Admitting: Gynecology

## 2014-04-10 ENCOUNTER — Ambulatory Visit (INDEPENDENT_AMBULATORY_CARE_PROVIDER_SITE_OTHER): Payer: 59 | Admitting: Gynecology

## 2014-04-10 ENCOUNTER — Encounter: Payer: Self-pay | Admitting: Gynecology

## 2014-04-10 ENCOUNTER — Other Ambulatory Visit (HOSPITAL_COMMUNITY)
Admission: RE | Admit: 2014-04-10 | Discharge: 2014-04-10 | Disposition: A | Discharge status: Home / Self Care | Payer: 59 | Source: Ambulatory Visit | Attending: Gynecology | Admitting: Gynecology

## 2014-04-10 VITALS — BP 128/80 | Ht 61.5 in | Wt 203.0 lb

## 2014-04-10 DIAGNOSIS — Z1151 Encounter for screening for human papillomavirus (HPV): Secondary | ICD-10-CM | POA: Insufficient documentation

## 2014-04-10 DIAGNOSIS — N946 Dysmenorrhea, unspecified: Secondary | ICD-10-CM

## 2014-04-10 DIAGNOSIS — Z01419 Encounter for gynecological examination (general) (routine) without abnormal findings: Secondary | ICD-10-CM

## 2014-04-10 DIAGNOSIS — R635 Abnormal weight gain: Secondary | ICD-10-CM

## 2014-04-10 LAB — COMPREHENSIVE METABOLIC PANEL
ALBUMIN: 3.7 g/dL (ref 3.5–5.2)
ALT: 18 U/L (ref 0–35)
AST: 12 U/L (ref 0–37)
Alkaline Phosphatase: 56 U/L (ref 39–117)
BUN: 10 mg/dL (ref 6–23)
CALCIUM: 8.4 mg/dL (ref 8.4–10.5)
CHLORIDE: 108 meq/L (ref 96–112)
CO2: 21 meq/L (ref 19–32)
CREATININE: 0.48 mg/dL — AB (ref 0.50–1.10)
Glucose, Bld: 99 mg/dL (ref 70–99)
POTASSIUM: 4 meq/L (ref 3.5–5.3)
Sodium: 136 mEq/L (ref 135–145)
Total Bilirubin: 1.1 mg/dL (ref 0.2–1.2)
Total Protein: 6.4 g/dL (ref 6.0–8.3)

## 2014-04-10 LAB — CBC WITH DIFFERENTIAL/PLATELET
Basophils Absolute: 0.1 10*3/uL (ref 0.0–0.1)
Basophils Relative: 1 % (ref 0–1)
EOS PCT: 4 % (ref 0–5)
Eosinophils Absolute: 0.2 10*3/uL (ref 0.0–0.7)
HEMATOCRIT: 39.7 % (ref 36.0–46.0)
HEMOGLOBIN: 13.6 g/dL (ref 12.0–15.0)
LYMPHS ABS: 1.7 10*3/uL (ref 0.7–4.0)
LYMPHS PCT: 29 % (ref 12–46)
MCH: 29.3 pg (ref 26.0–34.0)
MCHC: 34.3 g/dL (ref 30.0–36.0)
MCV: 85.6 fL (ref 78.0–100.0)
MONO ABS: 0.4 10*3/uL (ref 0.1–1.0)
MONOS PCT: 7 % (ref 3–12)
NEUTROS ABS: 3.5 10*3/uL (ref 1.7–7.7)
Neutrophils Relative %: 59 % (ref 43–77)
Platelets: 303 10*3/uL (ref 150–400)
RBC: 4.64 MIL/uL (ref 3.87–5.11)
RDW: 13.6 % (ref 11.5–15.5)
WBC: 6 10*3/uL (ref 4.0–10.5)

## 2014-04-10 LAB — LIPID PANEL
CHOL/HDL RATIO: 3.1 ratio
CHOLESTEROL: 190 mg/dL (ref 0–200)
HDL: 61 mg/dL (ref >39)
LDL Cholesterol: 112 mg/dL — ABNORMAL HIGH (ref 0–99)
Triglycerides: 86 mg/dL (ref <150)
VLDL: 17 mg/dL (ref 0–40)

## 2014-04-10 LAB — TSH: TSH: 1.168 u[IU]/mL (ref 0.350–4.500)

## 2014-04-10 NOTE — Addendum Note (Signed)
Addended by: Bertram SavinGONZALEZ-Freada [place]ers[place] A on: 04/10/2014 08:49 AM   Modules accepted: Orders

## 2014-04-10 NOTE — Progress Notes (Signed)
Crystal Colon 01/12/1976 [id number]   History:    38 y.o. gravida 4 para 2 Ab2 (2 ectopic pregnancies treated with methotrexate) for her annual exam with the only complaint is of midcycle ovulatory discomfort mostly on her left lower quadrant. Patient is having normal menstrual cycles lasting 3-5 days. Patient's husband has had a vasectomy. Patient with no prior history of abnormal Pap smears.   Past medical history,surgical history, family history and social history were all reviewed and documented in the EPIC chart.  Gynecologic History Patient's last menstrual period was 03/23/2014. Contraception: vasectomy Last Pap: 2012. Results were: normal Last mammogram: Not indicated. Results were: Not indicated  Obstetric History OB History  Gravida Para Term Preterm AB SAB TAB Ectopic Multiple Living  4 2 2  2   2  2     # Outcome Date GA Lbr Len/2nd Weight Sex Delivery Anes PTL Lv  4 ECT           3 ECT           2 TRM     F CS  N Y  1 TRM     M CS  N Y       ROS: A ROS was performed and pertinent positives and negatives are included in the history.  GENERAL: No fevers or chills. HEENT: No change in vision, no earache, sore throat or sinus congestion. NECK: No pain or stiffness. CARDIOVASCULAR: No chest pain or pressure. No palpitations. PULMONARY: No shortness of breath, cough or wheeze. GASTROINTESTINAL: No abdominal pain, nausea, vomiting or diarrhea, melena or bright red blood per rectum. GENITOURINARY: No urinary frequency, urgency, hesitancy or dysuria. MUSCULOSKELETAL: No joint or muscle pain, no back pain, no recent trauma. DERMATOLOGIC: No rash, no itching, no lesions. ENDOCRINE: No polyuria, polydipsia, no heat or cold intolerance. No recent change in weight. HEMATOLOGICAL: No anemia or easy bruising or bleeding. NEUROLOGIC: No headache, seizures, numbness, tingling or weakness. PSYCHIATRIC: No depression, no loss of interest in normal activity or change in sleep pattern.      Exam: chaperone present  BP 128/80  Ht 5' 1.5" (1.562 m)  Wt 203 lb (92.08 kg)  BMI 37.74 kg/m2  LMP 03/23/2014  Body mass index is 37.74 kg/(m^2).  General appearance : Well developed well nourished female. No acute distress HEENT: Neck supple, trachea midline, no carotid bruits, no thyroidmegaly Lungs: Clear to auscultation, no rhonchi or wheezes, or rib retractions  Heart: Regular rate and rhythm, no murmurs or gallops Breast:Examined in sitting and supine position were symmetrical in appearance, no palpable masses or tenderness,  no skin retraction, no nipple inversion, no nipple discharge, no skin discoloration, no axillary or supraclavicular lymphadenopathy Abdomen: no palpable masses or tenderness, no rebound or guarding Extremities: no edema or skin discoloration or tenderness  Pelvic:  Bartholin, Urethra, Skene Glands: Within normal limits             Vagina: No gross lesions or discharge  Cervix: No gross lesions or discharge  Uterus  anteverted, normal size, shape and consistency, non-tender and mobile  Adnexa  Without masses or tenderness  Anus and perineum  normal   Rectovaginal  normal sphincter tone without palpated masses or tenderness             Hemoccult not indicated     Assessment/Plan:  38 y.o. female for annual exam who has gained 8 pounds his last year. The patient with prior history of hypertension had been on lisinopril has  not see PCP and ovary year. Patient would like to change PCP. Patient with normal blood pressure today. We discussed importance of regular exercise and low-salt diet. The following labs were ordered today: CBC, TSH, comprehensive metabolic panel, fasting lipid profile, urinalysis and Pap smear. We discussed importance of monthly breast exam as well. All the above instructions were provided in Spanish. I'm going to refer her to one of my other medical colleagues who speaks Spanish to continue to monitor her for blood pressure  issues.  Note: This dictation was prepared with  Dragon/digital dictation along withSmart phrase technology. Any transcriptional errors that result from this process are unintentional.   Ok EdwardsJuan H [name] MD, 8:35 AM 04/10/2014

## 2014-04-11 LAB — URINALYSIS W MICROSCOPIC + REFLEX CULTURE
Bilirubin Urine: NEGATIVE
CASTS: NONE SEEN
CRYSTALS: NONE SEEN
GLUCOSE, UA: NEGATIVE mg/dL
HGB URINE DIPSTICK: NEGATIVE
Ketones, ur: NEGATIVE mg/dL
NITRITE: NEGATIVE
PH: 7 (ref 5.0–8.0)
Protein, ur: NEGATIVE mg/dL
SPECIFIC GRAVITY, URINE: 1.019 (ref 1.005–1.030)
Urobilinogen, UA: 0.2 mg/dL (ref 0.0–1.0)

## 2014-04-14 LAB — URINE CULTURE: Colony Count: 85000

## 2014-04-20 ENCOUNTER — Other Ambulatory Visit: Payer: Self-pay | Admitting: Gynecology

## 2014-04-20 MED ORDER — NITROFURANTOIN MONOHYD MACRO 100 MG PO CAPS: 100.0000 mg | ORAL_CAPSULE | Freq: Two times a day (BID) | ORAL | Status: DC

## 2014-05-29 ENCOUNTER — Encounter: Payer: Self-pay | Admitting: Gynecology

## 2014-05-29 ENCOUNTER — Ambulatory Visit (INDEPENDENT_AMBULATORY_CARE_PROVIDER_SITE_OTHER): Payer: 59 | Admitting: Gynecology

## 2014-05-29 VITALS — BP 120/84

## 2014-05-29 DIAGNOSIS — N9489 Other specified conditions associated with female genital organs and menstrual cycle: Secondary | ICD-10-CM

## 2014-05-29 DIAGNOSIS — N9089 Other specified noninflammatory disorders of vulva and perineum: Secondary | ICD-10-CM

## 2014-05-29 DIAGNOSIS — N907 Vulvar cyst: Secondary | ICD-10-CM

## 2014-05-29 MED ORDER — MUPIROCIN CALCIUM 2 % EX CREA
1.0000 | TOPICAL_CREAM | Freq: Two times a day (BID) | CUTANEOUS | Status: DC
Start: 2014-05-29 — End: 2016-12-08

## 2014-05-29 MED ORDER — DICLOXACILLIN SODIUM 500 MG PO CAPS: ORAL_CAPSULE | ORAL | Status: DC

## 2014-05-29 NOTE — Progress Notes (Signed)
   Patient presented to the office today stating that she noticed a lesion in her vagina a few days ago. She reports normal menstrual cycles. Her husband has had a vasectomy. She is in a monogamous relationship.  Exam: Bartholin urethra Skene glands: It was noted that midportion of the right labia minora there appeared to be a cystic like lesion that was draining suspicious for sebaceous cyst. A culture was obtained from this area. The rest of the external genitalia vagina and cervix with no lesions seen or discharge.  Assessment/plan right sebaceous cysts labia minora culture obtained. She will be placed on dicloxacillin 500 mg twice a day for 10 days. She will do sitz baths once a day. She will apply Bactroban cream topically to 3 times a day. If no improvement she will return back to the office for further evaluation.

## 2014-05-29 NOTE — Patient Instructions (Signed)
Quiste epidérmico  °(Epidermal Cyst) °Un quiste epidérmico se denomina también quiste sebáceo, quiste de inclusión epidérmica o quiste infundibular. Estos quistes contienen una sustancia "pastosa" o similar al "queso" y puede tener mal olor. Esta sustancia es una proteína denominada keratina. Estos quistes generalmente se forman en el rostro, el cuello o el tronco. También pueden aparecer en la zona vaginal u otras partes de los genitales, tanto en hombres como en mujeres. En general son pequeños bultos indoloros, que crecen lentamente y que se mueven libremente debajo de la piel. Es importante no tratar de apretarlos para extraer la sustancia que contienen. Esto puede ocasionar una infección que origine dolor e hinchazón en el área.  °CAUSAS  °La causa del puede ser una lesión penetrante profunda o un folículo piloso obstruido, generalmente asociado al acné.  °SÍNTOMAS  °Los quistes epidermicos pueden inflamarse y causar:  °· Enrojecimiento. °· Sensibilidad. °· Aumento de la temperatura en la zona. °· Material que drena de color blanco grisáceo, consistente y de olor desagradable. °DIAGNÓSTICO °Generalmente estas infecciones son diagnosticadas por el profesional durante el examen físico. En raras ocasiones será necesario realizar una biopsia para descartar otros trastornos que parezcan ser similares.  °TRATAMIENTO °· Generalmente mejoran y desaparecen sin tratamiento. No suelen ser peligrosos. °· Pueden inflamarse y sensibilizarse si se infectan. Esto puede requerir la apertura y drenaje del quiste. Podrá ser necesaria la administración de antibióticos. Cuando la infección haya desaparecido, el quiste podrá eliminarse con una cirugía menor. °· Los pequeños quistes inflamados generalmente pueden tratarse inyectado corticoides con los antibióticos. °· En algunos casos el quiste se agranda y puede ser una preocupación. Si esto ocurre, es necesario extirparlo quirúrgicamente en el consultorio del  profesional. °INSTRUCCIONES PARA EL CUIDADO EN EL HOGAR  °· Tome sólo medicamentos de venta libre o recetados, según las indicaciones del médico. °· Tome los antibióticos como se le indicó. Tómelos todos, aunque se sienta mejor. °SOLICITE ATENCIÓN MÉDICA SI:  °· Siente dolor, observa enrojecimiento o hinchazón. °· El problema no mejora, o empeora. °· Tiene preguntas o preocupaciones. °ASEGÚRESE DE QUE:  °· Comprende estas instrucciones. °· Controlará su enfermedad. °· Solicitará ayuda de inmediato si no mejora o si empeora. °Document Released: 01/08/2007 Document Revised: 02/19/2012 °ExitCare® Patient Information ©2015 ExitCare, LLC. This information is not intended to replace advice given to you by your health care provider. Make sure you discuss any questions you have with your health care provider. ° °

## 2014-06-01 LAB — WOUND CULTURE
Gram Stain: NONE SEEN
Gram Stain: NONE SEEN
Gram Stain: NONE SEEN
ORGANISM ID, BACTERIA: NORMAL

## 2014-10-12 ENCOUNTER — Encounter: Payer: Self-pay | Admitting: Gynecology

## 2015-08-13 ENCOUNTER — Encounter: Payer: Self-pay | Admitting: Gynecology

## 2015-08-13 ENCOUNTER — Ambulatory Visit (INDEPENDENT_AMBULATORY_CARE_PROVIDER_SITE_OTHER): Payer: 59 | Admitting: Gynecology

## 2015-08-13 VITALS — BP 120/78 | Ht 61.5 in | Wt 206.0 lb

## 2015-08-13 DIAGNOSIS — N920 Excessive and frequent menstruation with regular cycle: Secondary | ICD-10-CM

## 2015-08-13 DIAGNOSIS — Z23 Encounter for immunization: Secondary | ICD-10-CM | POA: Diagnosis not present

## 2015-08-13 DIAGNOSIS — Z01419 Encounter for gynecological examination (general) (routine) without abnormal findings: Secondary | ICD-10-CM | POA: Diagnosis not present

## 2015-08-13 LAB — COMPREHENSIVE METABOLIC PANEL
ALK PHOS: 66 U/L (ref 33–115)
ALT: 14 U/L (ref 6–29)
AST: 12 U/L (ref 10–30)
Albumin: 3.9 g/dL (ref 3.6–5.1)
BUN: 9 mg/dL (ref 7–25)
CO2: 26 mmol/L (ref 20–31)
Calcium: 8.8 mg/dL (ref 8.6–10.2)
Chloride: 103 mmol/L (ref 98–110)
Creat: 0.49 mg/dL — ABNORMAL LOW (ref 0.50–1.10)
GLUCOSE: 91 mg/dL (ref 65–99)
Potassium: 3.8 mmol/L (ref 3.5–5.3)
Sodium: 138 mmol/L (ref 135–146)
TOTAL PROTEIN: 6.6 g/dL (ref 6.1–8.1)
Total Bilirubin: 0.7 mg/dL (ref 0.2–1.2)

## 2015-08-13 LAB — CBC WITH DIFFERENTIAL/PLATELET
BASOS ABS: 0.1 10*3/uL (ref 0.0–0.1)
Basophils Relative: 1 % (ref 0–1)
EOS PCT: 1 % (ref 0–5)
Eosinophils Absolute: 0.1 10*3/uL (ref 0.0–0.7)
HEMATOCRIT: 40.4 % (ref 36.0–46.0)
Hemoglobin: 13.7 g/dL (ref 12.0–15.0)
LYMPHS PCT: 31 % (ref 12–46)
Lymphs Abs: 1.8 10*3/uL (ref 0.7–4.0)
MCH: 29.4 pg (ref 26.0–34.0)
MCHC: 33.9 g/dL (ref 30.0–36.0)
MCV: 86.7 fL (ref 78.0–100.0)
MPV: 8.7 fL (ref 8.6–12.4)
Monocytes Absolute: 0.3 10*3/uL (ref 0.1–1.0)
Monocytes Relative: 6 % (ref 3–12)
NEUTROS ABS: 3.5 10*3/uL (ref 1.7–7.7)
NEUTROS PCT: 61 % (ref 43–77)
Platelets: 337 10*3/uL (ref 150–400)
RBC: 4.66 MIL/uL (ref 3.87–5.11)
RDW: 13.3 % (ref 11.5–15.5)
WBC: 5.8 10*3/uL (ref 4.0–10.5)

## 2015-08-13 LAB — LIPID PANEL
Cholesterol: 196 mg/dL (ref 125–200)
HDL: 54 mg/dL (ref >=46)
LDL Cholesterol: 127 mg/dL (ref <130)
Total CHOL/HDL Ratio: 3.6 Ratio (ref <=5.0)
Triglycerides: 77 mg/dL (ref <150)
VLDL: 15 mg/dL (ref <30)

## 2015-08-13 LAB — TSH: TSH: 0.825 u[IU]/mL (ref 0.350–4.500)

## 2015-08-13 MED ORDER — TRANEXAMIC ACID 650 MG PO TABS: ORAL_TABLET | ORAL | Status: DC

## 2015-08-13 NOTE — Addendum Note (Signed)
Addended by: Berna Spare A on: 08/13/2015 10:56 AM   Modules accepted: Orders, SmartSet

## 2015-08-13 NOTE — Patient Instructions (Signed)
Tranexamic acid oral tablets Qu es este medicamento? El CIDO [employer] reduce o detiene el proceso mediante el cual los cogulos sanguneos se descomponen. Este medicamento se [place] para tratar los [employer]. Este medicamento puede ser utilizado para otros usos; si tiene alguna pregunta consulte con su proveedor de atencin mdica o con su farmacutico. MARCAS COMERCIALES DISPONIBLES: [place], Lysteda Qu le debo informar a mi profesional de la salud antes de tomar este medicamento? Necesita saber si usted presenta alguno de los siguientes problemas o situaciones: -sangrado en el cerebro -problemas de coagulacin -enfermedad renal -problemas de visin -una reaccin alrgica o inusual al cido tranexmico, a otros medicamentos, alimentos, colorantes o conservantes -si est embarazada o buscando quedar embarazada -si est amamantando a un beb Cmo debo utilizar este medicamento? Tome este medicamento por va oral con un vaso de agua. Siga las instrucciones de la etiqueta del [place]. No corte, triture ni mstique este medicamento. Puede tomarlo con o sin alimentos. Si le produce malestar estomacal, tmelo con alimentos. Tome sus dosis a intervalos regulares. No tome su medicamento con una frecuencia mayor a la indicada. No deje de tomarlo excepto si as lo indica su mdico. No tome este medicamento hasta que empieza su perodo. No lo tome durante ms de 5 das en seguidos. No tome este medicamento mientras no tiene su perodo. Hable con su pediatra para informarse acerca del uso de este medicamento en nios. Aunque este medicamento ha sido recetado a nias tan menores como de 12 aos de edad para condiciones selectivas, las precauciones se aplican. Sobredosis: Pngase en contacto inmediatamente con un centro toxicolgico o una sala de urgencia si usted cree que haya tomado demasiado medicamento. ATENCIN: [employer] es solo para usted. No comparta este  medicamento con nadie. Qu sucede si me olvido de una dosis? Si olvida una dosis, tmela cuando se recuerde y tome la prxima dosis por lo menos 6 horas despus. No tome ms de 2 tabletas a la vez para compensar las dosis olvidadas. Qu puede interactuar con este medicamento? No tome esta medicina con ninguno de los siguientes medicamentos: -hormonas femeninas, como estrgenos o progestinas y pldoras, parches, anillos o inyecciones anticonceptivas Esta medicina tambin puede Counselling psychologist con los siguientes medicamentos: -ciertos medicamentos usados para ayudar a que la sangre coagule o romper los cogulos de sangre -ciertos medicamentos usados para tratar la leucemia Puede ser que esta lista no menciona todas las posibles interacciones. Informe a su profesional de [employer]	[place] de [employer] productos a base de hierbas, medicamentos de [place] o suplementos nutritivos que est tomando. Si usted fuma, consume bebidas alcohlicas o si utiliza drogas ilegales, indqueselo tambin a su profesional de [employer]	[place]. Algunas sustancias pueden interactuar con su medicamento. A qu debo estar atento al usar [employer]? Informe a su mdico o su profesional de la salud si sus sntomas no comienzan a mejorar o si empeoran. Informe a su mdico o su profesional de la salud si observa cualquier problema con los ojos mientras recibe Coca[employer]a. Su mdico le referir a Optician, dispensing de los ojos que [employer] ojos. Qu efectos secundarios puedo tener al [employer] este medicamento? Efectos secundarios que debe informar a su mdico o a Barrister's clerk de la salud tan pronto como sea posible: -Chief of Staff como erupcin cutnea, picazn o urticarias, hinchazn de la cara, labios o lengua -problemas respiratorios -cambios en la visin -dolor repentino o grave en el pecho, piernas, cabeza o ingle -cansancio o debilidad inusual Efectos secundarios que,  por lo general, no requieren atencin mdica  (debe informarlos a su mdico o a su profesional de la salud si persisten o si son molestos): -dolor de espalda -dolor de cabeza -dolores musculares o articulares -problemas nasales o sinusales -dolor estomacal -cansancio Puede ser que esta lista no menciona todos los posibles efectos secundarios. Comunquese a su mdico por asesoramiento mdico [employer]. Usted puede informar los efectos secundarios a la FDA por telfono al 1-800-FDA-1088. Dnde debo guardar mi medicina? Mantngala fuera del alcance de los nios. Gurdela a [employer], entre 15 y 30 grados C (79 y 60 grados F). Deseche todo el medicamento que no haya utilizado, despus de la fecha de vencimiento. ATENCIN: Este folleto es un resumen. Puede ser que no cubra toda la posible informacin. Si usted tiene preguntas acerca de esta medicina, consulte con su mdico, su farmacutico o su profesional de Radiographer, therapeutic.  2015, Elsevier/Gold Standard. (2012-09-25 15:07:37)

## 2015-08-13 NOTE — Progress Notes (Signed)
Crystal Colon 1976-04-25 [id number]   History:    39 y.o.  for annual gyn exam with the only complaint is of heavy cycles. Patient interested in receiving the flu vaccine today. Patient's husband has had a vasectomy. Patient cycles otherwise have been regular. Patient with no past history of abnormal Pap smear.  Past medical history,surgical history, family history and social history were all reviewed and documented in the EPIC chart.  Gynecologic History Patient's last menstrual period was 08/05/2015. Contraception: vasectomy Last Pap: 2015. Results were: normal Last mammogram: Not indicated. Results were: Not indicated  Obstetric History OB History  Gravida Para Term Preterm AB SAB TAB Ectopic Multiple Living  4 2 2  2   2  2     # Outcome Date GA Lbr Len/2nd Weight Sex Delivery Anes PTL Lv  4 Ectopic           3 Ectopic           2 Term     F CS-Unspec  N Y  1 Term     M CS-Unspec  N Y       ROS: A ROS was performed and pertinent positives and negatives are included in the history.  GENERAL: No fevers or chills. HEENT: No change in vision, no earache, sore throat or sinus congestion. NECK: No pain or stiffness. CARDIOVASCULAR: No chest pain or pressure. No palpitations. PULMONARY: No shortness of breath, cough or wheeze. GASTROINTESTINAL: No abdominal pain, nausea, vomiting or diarrhea, melena or bright red blood per rectum. GENITOURINARY: No urinary frequency, urgency, hesitancy or dysuria. MUSCULOSKELETAL: No joint or muscle pain, no back pain, no recent trauma. DERMATOLOGIC: No rash, no itching, no lesions. ENDOCRINE: No polyuria, polydipsia, no heat or cold intolerance. No recent change in weight. HEMATOLOGICAL: No anemia or easy bruising or bleeding. NEUROLOGIC: No headache, seizures, numbness, tingling or weakness. PSYCHIATRIC: No depression, no loss of interest in normal activity or change in sleep pattern.     Exam: chaperone present  BP 120/78 mmHg  Ht 5' 1.5"  (1.562 m)  Wt 206 lb (93.441 kg)  BMI 38.30 kg/m2  LMP 08/05/2015  Body mass index is 38.3 kg/(m^2).  General appearance : Well developed well nourished female. No acute distress HEENT: Eyes: no retinal hemorrhage or exudates,  Neck supple, trachea midline, no carotid bruits, no thyroidmegaly Lungs: Clear to auscultation, no rhonchi or wheezes, or rib retractions  Heart: Regular rate and rhythm, no murmurs or gallops Breast:Examined in sitting and supine position were symmetrical in appearance, no palpable masses or tenderness,  no skin retraction, no nipple inversion, no nipple discharge, no skin discoloration, no axillary or supraclavicular lymphadenopathy Abdomen: no palpable masses or tenderness, no rebound or guarding Extremities: no edema or skin discoloration or tenderness  Pelvic:  Bartholin, Urethra, Skene Glands: Within normal limits             Vagina: No gross lesions or discharge  Cervix: No gross lesions or discharge  Uterus  anteverted, normal size, shape and consistency, non-tender and mobile  Adnexa  Without masses or tenderness  Anus and perineum  normal   Rectovaginal  normal sphincter tone without palpated masses or tenderness             Hemoccult not indicated     Assessment/Plan:  39 y.o. female for annual exam who will be prescribed Lysteda 650 mg tablets. She is to take 2 tablets 3 times a day at time of her menses for 5  days to help with her menorrhagia. The following screening blood work was ordered today: Comprehensive metabolic panel, fasting lipid profile, TSH, CBC, and urinalysis. Pap smear not indicated this year.   Ok Edwards MD, 10:50 AM 08/13/2015

## 2015-08-14 LAB — URINALYSIS W MICROSCOPIC + REFLEX CULTURE
BACTERIA UA: NONE SEEN [HPF]
BILIRUBIN URINE: NEGATIVE
CASTS: NONE SEEN [LPF]
CRYSTALS: NONE SEEN [HPF]
Glucose, UA: NEGATIVE
KETONES UR: NEGATIVE
Leukocytes, UA: NEGATIVE
Nitrite: NEGATIVE
Protein, ur: NEGATIVE
Specific Gravity, Urine: 1.016 (ref 1.001–1.035)
Yeast: NONE SEEN [HPF]
pH: 7.5 (ref 5.0–8.0)

## 2015-08-18 ENCOUNTER — Other Ambulatory Visit: Payer: Self-pay | Admitting: Gynecology

## 2015-08-18 LAB — URINE CULTURE: Colony Count: 50000

## 2015-08-18 MED ORDER — NITROFURANTOIN MONOHYD MACRO 100 MG PO CAPS: 100.0000 mg | ORAL_CAPSULE | Freq: Two times a day (BID) | ORAL | Status: DC

## 2016-12-08 ENCOUNTER — Ambulatory Visit (INDEPENDENT_AMBULATORY_CARE_PROVIDER_SITE_OTHER): Payer: 59 | Admitting: Gynecology

## 2016-12-08 ENCOUNTER — Encounter: Payer: Self-pay | Admitting: Gynecology

## 2016-12-08 ENCOUNTER — Encounter: Payer: 59 | Admitting: Gynecology

## 2016-12-08 VITALS — BP 114/70 | Ht 62.0 in | Wt 193.2 lb

## 2016-12-08 DIAGNOSIS — Z01419 Encounter for gynecological examination (general) (routine) without abnormal findings: Secondary | ICD-10-CM | POA: Diagnosis not present

## 2016-12-08 DIAGNOSIS — Z23 Encounter for immunization: Secondary | ICD-10-CM | POA: Diagnosis not present

## 2016-12-08 LAB — CBC WITH DIFFERENTIAL/PLATELET
BASOS ABS: 58 {cells}/uL (ref 0–200)
Basophils Relative: 1 %
EOS PCT: 2 %
Eosinophils Absolute: 116 cells/uL (ref 15–500)
HCT: 43.2 % (ref 35.0–45.0)
Hemoglobin: 14 g/dL (ref 11.7–15.5)
Lymphocytes Relative: 33 %
Lymphs Abs: 1914 cells/uL (ref 850–3900)
MCH: 29.3 pg (ref 27.0–33.0)
MCHC: 32.4 g/dL (ref 32.0–36.0)
MCV: 90.4 fL (ref 80.0–100.0)
MONOS PCT: 6 %
MPV: 8.9 fL (ref 7.5–12.5)
Monocytes Absolute: 348 cells/uL (ref 200–950)
NEUTROS ABS: 3364 {cells}/uL (ref 1500–7800)
Neutrophils Relative %: 58 %
PLATELETS: 336 10*3/uL (ref 140–400)
RBC: 4.78 MIL/uL (ref 3.80–5.10)
RDW: 13 % (ref 11.0–15.0)
WBC: 5.8 10*3/uL (ref 3.8–10.8)

## 2016-12-08 LAB — TSH: TSH: 0.94 mIU/L

## 2016-12-08 LAB — COMPREHENSIVE METABOLIC PANEL
ALT: 13 U/L (ref 6–29)
AST: 13 U/L (ref 10–30)
Albumin: 3.9 g/dL (ref 3.6–5.1)
Alkaline Phosphatase: 61 U/L (ref 33–115)
BUN: 11 mg/dL (ref 7–25)
CHLORIDE: 105 mmol/L (ref 98–110)
CO2: 24 mmol/L (ref 20–31)
CREATININE: 0.49 mg/dL — AB (ref 0.50–1.10)
Calcium: 8.7 mg/dL (ref 8.6–10.2)
GLUCOSE: 85 mg/dL (ref 65–99)
Potassium: 3.7 mmol/L (ref 3.5–5.3)
Sodium: 137 mmol/L (ref 135–146)
TOTAL PROTEIN: 6.7 g/dL (ref 6.1–8.1)
Total Bilirubin: 1 mg/dL (ref 0.2–1.2)

## 2016-12-08 LAB — LIPID PANEL
Cholesterol: 211 mg/dL — ABNORMAL HIGH (ref <200)
HDL: 65 mg/dL (ref >50)
LDL CALC: 129 mg/dL — AB (ref <100)
Total CHOL/HDL Ratio: 3.2 Ratio (ref <5.0)
Triglycerides: 85 mg/dL (ref <150)
VLDL: 17 mg/dL (ref <30)

## 2016-12-08 NOTE — Patient Instructions (Addendum)
Influenza Virus Vaccine (Flucelvax) Qu es este medicamento? La VACUNA ANTIGRIPAL ayuda a disminuir el riesgo de contraer la influenza, tambin conocida como la gripe. La vacuna solo ayuda a protegerle contra algunas cepas de influenza. MARCAS COMUNES: FLUCELVAX Qu le debo informar a mi profesional de la salud antes de tomar este medicamento? Necesita saber si usted presenta alguno de los siguientes problemas o situaciones: -trastorno de sangrado como hemofilia -fiebre o infeccin -sndrome de Guillain-Barre u otros problemas neurolgicos -problemas del sistema inmunolgico -infeccin por el virus de la inmunodeficiencia humana (VIH) o SIDA -niveles bajos de plaquetas en la sangre -esclerosis mltiple -una reaccin alrgica o inusual a las vacunas antigripales, a otros medicamentos, alimentos, colorantes o conservantes -si est embarazada o buscando quedar embarazada -si est amamantando a un beb Cmo debo utilizar este medicamento? Esta vacuna se administra mediante inyeccin por va intramuscular. Lo administra un profesional de la salud. Recibir una copia de informacin escrita sobre la vacuna antes de cada vacuna. Asegrese de leer este folleto cada vez cuidadosamente. Este folleto puede cambiar con frecuencia. Hable con su pediatra para informarse acerca del uso de este medicamento en nios. Puede requerir atencin especial. Qu sucede si me olvido de una dosis? No se aplica en este caso. Qu puede interactuar con este medicamento? -quimioterapia o radioterapia -medicamentos que suprimen el sistema inmunolgico, tales como etanercept, anakinra, infliximab y adalimumab -medicamentos que tratan o previenen cogulos sanguneos, como warfarina -fenitona -medicamentos esteroideos, como la prednisona o la cortisona -teofilina -vacunas A qu debo estar atento al usar este medicamento? Informe a su mdico o a su profesional de la salud sobre todos los efectos secundarios que  persistan despus de 3 das. Llame a su proveedor de atencin mdica si se presentan sntomas inusuales dentro de las 6 semanas de recibir esta vacuna. Es posible que todava pueda contraer la gripe, pero la enfermedad no ser tan fuerte como normalmente. No puede contraer la gripe de esta vacuna. La vacuna antigripal no le protege contra resfros u otras enfermedades que pueden causar fiebre. Debe vacunarse cada ao. Qu efectos secundarios puedo tener al utilizar este medicamento? Efectos secundarios que debe informar a su mdico o a su profesional de la salud tan pronto como sea posible: -reacciones alrgicas como erupcin cutnea, picazn o urticarias, hinchazn de la cara, labios o lengua Efectos secundarios que, por lo general, no requieren atencin mdica (debe informarlos a su mdico o a su profesional de la salud si persisten o si son molestos): -fiebre -dolor de cabeza -molestias y dolores musculares -dolor, sensibilidad, enrojecimiento o hinchazn en el lugar de la inyeccin -cansancio Dnde debo guardar mi medicina? Esta vacuna se administrar por un profesional de la salud en una clnica, farmacia, consultorio mdico u otro consultorio de un profesional de la salud. No se le suministrar esta vacuna para guardar en su domicilio.  2017 Elsevier/Gold Standard (2011-11-13 16:29:16)  

## 2016-12-08 NOTE — Addendum Note (Signed)
Addended by: Richardson ChiquitoWILKINSON, Marquies Wanat S on: 12/08/2016 12:38 PM   Modules accepted: Orders

## 2016-12-08 NOTE — Progress Notes (Signed)
Crystal EhrichYolanda Colon 01/07/1976 [id number]   History:    40 y.o.  for annual gyn exam with no complaints today. Patient was treated for herpes zoster in September of this year. Patient reports normal menstrual cycles. Patient requesting a flu vaccine today. Patient has not had her baseline mammogram as of yet. Patient with no past history of any abnormal Pap smears.  Past medical history,surgical history, family history and social history were all reviewed and documented in the EPIC chart.  Gynecologic History Patient's last menstrual period was 11/14/2016 (exact date). Contraception: vasectomy Last Pap: 2015. Results were: normal Last mammogram: No previous study. Results were: No previous study  Obstetric History OB History  Gravida Para Term Preterm AB Living  4 2 2   2 2   SAB TAB Ectopic Multiple Live Births      2   2    # Outcome Date GA Lbr Len/2nd Weight Sex Delivery Anes PTL Lv  4 Ectopic           3 Ectopic           2 Term     F CS-Unspec  N LIV  1 Term     M CS-Unspec  N LIV       ROS: A ROS was performed and pertinent positives and negatives are included in the history.  GENERAL: No fevers or chills. HEENT: No change in vision, no earache, sore throat or sinus congestion. NECK: No pain or stiffness. CARDIOVASCULAR: No chest pain or pressure. No palpitations. PULMONARY: No shortness of breath, cough or wheeze. GASTROINTESTINAL: No abdominal pain, nausea, vomiting or diarrhea, melena or bright red blood per rectum. GENITOURINARY: No urinary frequency, urgency, hesitancy or dysuria. MUSCULOSKELETAL: No joint or muscle pain, no back pain, no recent trauma. DERMATOLOGIC: No rash, no itching, no lesions. ENDOCRINE: No polyuria, polydipsia, no heat or cold intolerance. No recent change in weight. HEMATOLOGICAL: No anemia or easy bruising or bleeding. NEUROLOGIC: No headache, seizures, numbness, tingling or weakness. PSYCHIATRIC: No depression, no loss of interest in normal activity  or change in sleep pattern.     Exam: chaperone present  BP 114/70   Ht 5\' 2"  (1.575 m)   Wt 193 lb 3.2 oz (87.6 kg)   LMP 11/14/2016 (Exact Date)   BMI 35.34 kg/m   Body mass index is 35.34 kg/m.  General appearance : Well developed well nourished female. No acute distress HEENT: Eyes: no retinal hemorrhage or exudates,  Neck supple, trachea midline, no carotid bruits, no thyroidmegaly Lungs: Clear to auscultation, no rhonchi or wheezes, or rib retractions  Heart: Regular rate and rhythm, no murmurs or gallops Breast:Examined in sitting and supine position were symmetrical in appearance, no palpable masses or tenderness,  no skin retraction, no nipple inversion, no nipple discharge, no skin discoloration, no axillary or supraclavicular lymphadenopathy Abdomen: no palpable masses or tenderness, no rebound or guarding Extremities: no edema or skin discoloration or tenderness  Pelvic:  Bartholin, Urethra, Skene Glands: Within normal limits             Vagina: No gross lesions or discharge  Cervix: No gross lesions or discharge  Uterus  anteverted, normal size, shape and consistency, non-tender and mobile  Adnexa  Without masses or tenderness  Anus and perineum  normal   Rectovaginal  normal sphincter tone without palpated masses or tenderness             Hemoccult not indicated     Assessment/Plan:  40  y.o. female for annual exam requesting to receive the flu vaccine today. She was counseled and literature information provided. The following screening fasting blood work will be ordered today: Fasting lipid profile, comprehensive metabolic panel, TSH, CBC, and urinalysis. We discussed importance of monthly breast exam. Requisition was provided for to schedule her mammogram.   Ok EdwardsFERNANDEZ,Crystal Colon H MD, 11:21 AM 12/08/2016

## 2016-12-09 LAB — URINALYSIS W MICROSCOPIC + REFLEX CULTURE
Bacteria, UA: NONE SEEN [HPF]
Bilirubin Urine: NEGATIVE
Casts: NONE SEEN [LPF]
Crystals: NONE SEEN [HPF]
Glucose, UA: NEGATIVE
HGB URINE DIPSTICK: NEGATIVE
Ketones, ur: NEGATIVE
Leukocytes, UA: NEGATIVE
NITRITE: NEGATIVE
PH: 7 (ref 5.0–8.0)
Protein, ur: NEGATIVE
RBC / HPF: NONE SEEN RBC/HPF (ref <=2)
SPECIFIC GRAVITY, URINE: 1.02 (ref 1.001–1.035)
WBC, UA: NONE SEEN WBC/HPF (ref <=5)
YEAST: NONE SEEN [HPF]

## 2016-12-13 ENCOUNTER — Other Ambulatory Visit: Payer: Self-pay | Admitting: Gynecology

## 2016-12-13 DIAGNOSIS — Z1231 Encounter for screening mammogram for malignant neoplasm of breast: Secondary | ICD-10-CM

## 2017-01-10 ENCOUNTER — Ambulatory Visit
Admission: RE | Admit: 2017-01-10 | Discharge: 2017-01-10 | Disposition: A | Discharge status: Home / Self Care | Payer: 59 | Source: Ambulatory Visit | Attending: Gynecology | Admitting: Gynecology

## 2017-01-10 DIAGNOSIS — Z1231 Encounter for screening mammogram for malignant neoplasm of breast: Secondary | ICD-10-CM

## 2017-04-25 ENCOUNTER — Encounter: Payer: Self-pay | Admitting: Gynecology

## 2018-08-02 ENCOUNTER — Encounter: Payer: Self-pay | Admitting: Obstetrics & Gynecology

## 2018-08-02 ENCOUNTER — Ambulatory Visit: Payer: 59 | Admitting: Obstetrics & Gynecology

## 2018-08-02 VITALS — BP 132/84 | Ht 62.0 in | Wt 208.0 lb

## 2018-08-02 DIAGNOSIS — N920 Excessive and frequent menstruation with regular cycle: Secondary | ICD-10-CM

## 2018-08-02 DIAGNOSIS — Z6838 Body mass index (BMI) 38.0-38.9, adult: Secondary | ICD-10-CM

## 2018-08-02 DIAGNOSIS — Z01419 Encounter for gynecological examination (general) (routine) without abnormal findings: Secondary | ICD-10-CM | POA: Diagnosis not present

## 2018-08-02 DIAGNOSIS — E6609 Other obesity due to excess calories: Secondary | ICD-10-CM | POA: Diagnosis not present

## 2018-08-02 DIAGNOSIS — Z9189 Other specified personal risk factors, not elsewhere classified: Secondary | ICD-10-CM | POA: Diagnosis not present

## 2018-08-02 NOTE — Patient Instructions (Signed)
1. Encounter for routine gynecological examination with Papanicolaou smear of cervix Normal gynecologic exam.  Pap reflex done.  Breast exam normal.  Will schedule screening mammogram now at the breast center.  Fasting health labs here today.  Vitamin D supplements recommended, will assess the need with the vitamin D level done today.  Recommend a total of calcium intake at 1.2 g/day including nutritional and supplemental. - CBC - Comp Met (CMET) - Lipid panel - TSH - VITAMIN D 25 Hydroxy (Vit-D Deficiency, Fractures)  2. Relies on partner vasectomy for contraception  3. Menorrhagia with regular cycle Mild increase in menstrual flow on day 1 and 2 of the period, but with thin normal limits with no breakthrough bleeding and no pelvic pain.  Will try ibuprofen 600 mg regularly every 6 hours x first 3 days of cycles to decrease the menstrual flow.  Will check a CBC and TSH today.  If her menstrual periods become heavier or irregular, patient will call for evaluation with a pelvic ultrasound. - CBC - TSH  4. Class 2 obesity due to excess calories without serious comorbidity with body mass index (BMI) of 38.0 to 38.9 in adult Recommend low calorie/low carb diet such as [place] diet.  Increase physical activity with aerobic activities 5 times a week and weightlifting every 2 days.  [name], fue un placer encontrarle hoy!  Voy a informarle de sus resultados muy pronto. 

## 2018-08-02 NOTE — Addendum Note (Signed)
Addended by: Berna SpareASTILLO, Treasa Bradshaw A on: 08/02/2018 09:02 AM   Modules accepted: Orders

## 2018-08-02 NOTE — Progress Notes (Signed)
Crystal Colon 1976-06-28 [id number]   History:    42 y.o. G15P2A2 Married.  Vasectomy.  Daughter 73 yo, son 73 yo.  RP:  Established patient presenting for annual gyn exam   HPI: Menstrual periods regular every month, lasting 5 days, but first 2 days progressively heavier.  No pelvic pain.  Normal vaginal secretions.  No pain with intercourse.  Urine and bowel movements normal.  Breasts normal.  Fasting health labs here today.  Past medical history,surgical history, family history and social history were all reviewed and documented in the EPIC chart.  Gynecologic History Patient's last menstrual period was 07/23/2018. Contraception: vasectomy Last Pap: 04/2014. Results were: Negative Last mammogram: 12/2016. Results were: Negative Bone Density: Never Colonoscopy: Never  Obstetric History OB History  Gravida Para Term Preterm AB Living  '4 2 2   2 2  ' SAB TAB Ectopic Multiple Live Births      2   2    # Outcome Date GA Lbr Len/2nd Weight Sex Delivery Anes PTL Lv  4 Ectopic           3 Ectopic           2 Term     F CS-Unspec  N LIV  1 Term     M CS-Unspec  N LIV     ROS: A ROS was performed and pertinent positives and negatives are included in the history.  GENERAL: No fevers or chills. HEENT: No change in vision, no earache, sore throat or sinus congestion. NECK: No pain or stiffness. CARDIOVASCULAR: No chest pain or pressure. No palpitations. PULMONARY: No shortness of breath, cough or wheeze. GASTROINTESTINAL: No abdominal pain, nausea, vomiting or diarrhea, melena or bright red blood per rectum. GENITOURINARY: No urinary frequency, urgency, hesitancy or dysuria. MUSCULOSKELETAL: No joint or muscle pain, no back pain, no recent trauma. DERMATOLOGIC: No rash, no itching, no lesions. ENDOCRINE: No polyuria, polydipsia, no heat or cold intolerance. No recent change in weight. HEMATOLOGICAL: No anemia or easy bruising or bleeding. NEUROLOGIC: No headache, seizures, numbness,  tingling or weakness. PSYCHIATRIC: No depression, no loss of interest in normal activity or change in sleep pattern.     Exam:   Ht '5\' 2"'  (1.575 m)   Wt 208 lb (94.3 kg)   LMP 07/23/2018 Comment: patient's husband with vasectomy  BMI 38.04 kg/m   Body mass index is 38.04 kg/m.  General appearance : Well developed well nourished female. No acute distress HEENT: Eyes: no retinal hemorrhage or exudates,  Neck supple, trachea midline, no carotid bruits, no thyroidmegaly Lungs: Clear to auscultation, no rhonchi or wheezes, or rib retractions  Heart: Regular rate and rhythm, no murmurs or gallops Breast:Examined in sitting and supine position were symmetrical in appearance, no palpable masses or tenderness,  no skin retraction, no nipple inversion, no nipple discharge, no skin discoloration, no axillary or supraclavicular lymphadenopathy Abdomen: no palpable masses or tenderness, no rebound or guarding Extremities: no edema or skin discoloration or tenderness  Pelvic: Vulva: Normal             Vagina: No gross lesions or discharge  Cervix: No gross lesions or discharge.  Pap reflex done  Uterus  AV, normal size, shape and consistency, non-tender and mobile  Adnexa  Without masses or tenderness  Anus: Normal   Assessment/Plan:  42 y.o. female for annual exam   1. Encounter for routine gynecological examination with Papanicolaou smear of cervix Normal gynecologic exam.  Pap reflex done.  Breast  exam normal.  Will schedule screening mammogram now at the breast center.  Fasting health labs here today.  Vitamin D supplements recommended, will assess the need with the vitamin D level done today.  Recommend a total of calcium intake at 1.2 g/day including nutritional and supplemental. - CBC - Comp Met (CMET) - Lipid panel - TSH - VITAMIN D 25 Hydroxy (Vit-D Deficiency, Fractures)  2. Relies on partner vasectomy for contraception  3. Menorrhagia with regular cycle Mild increase in  menstrual flow on day 1 and 2 of the period, but with thin normal limits with no breakthrough bleeding and no pelvic pain.  Will try ibuprofen 600 mg regularly every 6 hours x first 3 days of cycles to decrease the menstrual flow.  Will check a CBC and TSH today.  If her menstrual periods become heavier or irregular, patient will call for evaluation with a pelvic ultrasound. - CBC - TSH  4. Class 2 obesity due to excess calories without serious comorbidity with body mass index (BMI) of 38.0 to 38.9 in adult Recommend low calorie/low carb diet such as [employer].  Increase physical activity with aerobic activities 5 times a week and weightlifting every 2 days.  Princess Bruins MD, 8:13 AM 08/02/2018

## 2018-08-03 LAB — CBC
HEMATOCRIT: 40.5 % (ref 35.0–45.0)
HEMOGLOBIN: 13.4 g/dL (ref 11.7–15.5)
MCH: 29.2 pg (ref 27.0–33.0)
MCHC: 33.1 g/dL (ref 32.0–36.0)
MCV: 88.2 fL (ref 80.0–100.0)
MPV: 9.5 fL (ref 7.5–12.5)
Platelets: 334 10*3/uL (ref 140–400)
RBC: 4.59 10*6/uL (ref 3.80–5.10)
RDW: 12.4 % (ref 11.0–15.0)
WBC: 5.7 10*3/uL (ref 3.8–10.8)

## 2018-08-03 LAB — COMPREHENSIVE METABOLIC PANEL
AG RATIO: 1.5 (calc) (ref 1.0–2.5)
ALBUMIN MSPROF: 3.8 g/dL (ref 3.6–5.1)
ALKALINE PHOSPHATASE (APISO): 62 U/L (ref 33–115)
ALT: 12 U/L (ref 6–29)
AST: 10 U/L (ref 10–30)
BUN: 11 mg/dL (ref 7–25)
CHLORIDE: 106 mmol/L (ref 98–110)
CO2: 24 mmol/L (ref 20–32)
Calcium: 8.6 mg/dL (ref 8.6–10.2)
Creat: 0.53 mg/dL (ref 0.50–1.10)
GLOBULIN: 2.6 g/dL (ref 1.9–3.7)
Glucose, Bld: 96 mg/dL (ref 65–99)
POTASSIUM: 4 mmol/L (ref 3.5–5.3)
Sodium: 139 mmol/L (ref 135–146)
Total Bilirubin: 0.6 mg/dL (ref 0.2–1.2)
Total Protein: 6.4 g/dL (ref 6.1–8.1)

## 2018-08-03 LAB — TSH: TSH: 0.9 mIU/L

## 2018-08-03 LAB — LIPID PANEL
Cholesterol: 193 mg/dL (ref <200)
HDL: 58 mg/dL (ref >50)
LDL Cholesterol (Calc): 116 mg/dL (calc) — ABNORMAL HIGH
Non-HDL Cholesterol (Calc): 135 mg/dL (calc) — ABNORMAL HIGH (ref <130)
Total CHOL/HDL Ratio: 3.3 (calc) (ref <5.0)
Triglycerides: 88 mg/dL (ref <150)

## 2018-08-03 LAB — VITAMIN D 25 HYDROXY (VIT D DEFICIENCY, FRACTURES): Vit D, 25-Hydroxy: 14 ng/mL — ABNORMAL LOW (ref 30–100)

## 2018-08-05 LAB — PAP IG W/ RFLX HPV ASCU

## 2018-08-07 ENCOUNTER — Other Ambulatory Visit: Payer: Self-pay | Admitting: Anesthesiology

## 2018-08-07 MED ORDER — VITAMIN D (ERGOCALCIFEROL) 1.25 MG (50000 UNIT) PO CAPS: 50000.0000 [IU] | ORAL_CAPSULE | ORAL | 0 refills | Status: DC

## 2019-08-08 ENCOUNTER — Ambulatory Visit: Payer: 59 | Admitting: Obstetrics & Gynecology

## 2019-08-08 ENCOUNTER — Other Ambulatory Visit: Payer: Self-pay

## 2019-08-08 ENCOUNTER — Encounter: Payer: Self-pay | Admitting: Obstetrics & Gynecology

## 2019-08-08 VITALS — BP 130/70 | Ht 62.0 in | Wt 215.0 lb

## 2019-08-08 DIAGNOSIS — Z01419 Encounter for gynecological examination (general) (routine) without abnormal findings: Secondary | ICD-10-CM

## 2019-08-08 DIAGNOSIS — Z6839 Body mass index (BMI) 39.0-39.9, adult: Secondary | ICD-10-CM

## 2019-08-08 DIAGNOSIS — E6609 Other obesity due to excess calories: Secondary | ICD-10-CM

## 2019-08-08 DIAGNOSIS — Z9189 Other specified personal risk factors, not elsewhere classified: Secondary | ICD-10-CM

## 2019-08-08 NOTE — Progress Notes (Signed)
Crystal Colon 22-Dec-1975 [id number]   History:    43 y.o. G4P2A2L2 Married.  Vasectomy.  Daughter 33 yo, son 33 yo.  RP:  Established patient presenting for annual gyn exam   HPI: Menstrual periods every month with normal flow.  Mild dysmenorrhea controled with Ibuprofen.  No breakthrough bleeding.  No pelvic pain.  No pain with intercourse.  Urine and bowel movements normal.  Breasts normal.  Body mass index 39.32.  Needs to exercise more and reduce calories.  Fasting health labs here today.  Past medical history,surgical history, family history and social history were all reviewed and documented in the EPIC chart.  Gynecologic History Patient's last menstrual period was 08/01/2019. Contraception: vasectomy Last Pap: 07/2018. Results were: Negative Last mammogram: 12/2016. Results were: Negative Bone Density: Never Colonoscopy: Never  Obstetric History OB History  Gravida Para Term Preterm AB Living  _0 SAB TAB Ectopic Multiple Live Births      2   2    # Outcome Date GA Lbr Len/2nd Weight Sex Delivery Anes PTL Lv  4 Ectopic           3 Ectopic           2 Term     F CS-Unspec  N LIV  1 Term     M CS-Unspec  N LIV     ROS: A ROS was performed and pertinent positives and negatives are included in the history.  GENERAL: No fevers or chills. HEENT: No change in vision, no earache, sore throat or sinus congestion. NECK: No pain or stiffness. CARDIOVASCULAR: No chest pain or pressure. No palpitations. PULMONARY: No shortness of breath, cough or wheeze. GASTROINTESTINAL: No abdominal pain, nausea, vomiting or diarrhea, melena or bright red blood per rectum. GENITOURINARY: No urinary frequency, urgency, hesitancy or dysuria. MUSCULOSKELETAL: No joint or muscle pain, no back pain, no recent trauma. DERMATOLOGIC: No rash, no itching, no lesions. ENDOCRINE: No polyuria, polydipsia, no heat or cold intolerance. No recent change in weight. HEMATOLOGICAL: No anemia or easy  bruising or bleeding. NEUROLOGIC: No headache, seizures, numbness, tingling or weakness. PSYCHIATRIC: No depression, no loss of interest in normal activity or change in sleep pattern.     Exam:   BP 130/70   Ht _1  (1.575 m)   Wt 215 lb (97.5 kg)   LMP 08/01/2019   BMI 39.32 kg/m   Body mass index is 39.32 kg/m.  General appearance : Well developed well nourished female. No acute distress HEENT: Eyes: no retinal hemorrhage or exudates,  Neck supple, trachea midline, no carotid bruits, no thyroidmegaly Lungs: Clear to auscultation, no rhonchi or wheezes, or rib retractions  Heart: Regular rate and rhythm, no murmurs or gallops Breast:Examined in sitting and supine position were symmetrical in appearance, no palpable masses or tenderness,  no skin retraction, no nipple inversion, no nipple discharge, no skin discoloration, no axillary or supraclavicular lymphadenopathy Abdomen: no palpable masses or tenderness, no rebound or guarding Extremities: no edema or skin discoloration or tenderness  Pelvic: Vulva: Normal             Vagina: No gross lesions or discharge  Cervix: No gross lesions or discharge  Uterus  AV, normal size, shape and consistency, non-tender and mobile  Adnexa  Without masses or tenderness  Anus: Normal   Assessment/Plan:  43 y.o. female for annual exam   1. Well female exam with routine gynecological exam Normal gynecologic exam.  Pap test August 2019 was negative, no indication to repeat this year.  Breast exam normal.  Screening mammogram to schedule now.  Fasting health labs here today. - CBC - Comp Met (CMET) - TSH - Lipid panel - VITAMIN D 25 Hydroxy (Vit-D Deficiency, Fractures)  2. Relies on partner vasectomy for contraception  3. Class 2 obesity due to excess calories without serious comorbidity with body mass index (BMI) of 39.0 to 39.9 in adult Recommend lower calorie/carb diet such as [employer].  Aerobic physical activities 5 times a  week and weightlifting every 2 days.  Princess Bruins MD, 8:56 AM 08/08/2019

## 2019-08-09 LAB — COMPREHENSIVE METABOLIC PANEL
AG Ratio: 1.5 (calc) (ref 1.0–2.5)
ALT: 15 U/L (ref 6–29)
AST: 12 U/L (ref 10–30)
Albumin: 4.1 g/dL (ref 3.6–5.1)
Alkaline phosphatase (APISO): 64 U/L (ref 31–125)
BUN: 13 mg/dL (ref 7–25)
CO2: 22 mmol/L (ref 20–32)
Calcium: 9.3 mg/dL (ref 8.6–10.2)
Chloride: 107 mmol/L (ref 98–110)
Creat: 0.57 mg/dL (ref 0.50–1.10)
Globulin: 2.7 g/dL (calc) (ref 1.9–3.7)
Glucose, Bld: 98 mg/dL (ref 65–99)
Potassium: 3.9 mmol/L (ref 3.5–5.3)
Sodium: 138 mmol/L (ref 135–146)
Total Bilirubin: 0.7 mg/dL (ref 0.2–1.2)
Total Protein: 6.8 g/dL (ref 6.1–8.1)

## 2019-08-09 LAB — CBC
HCT: 43.8 % (ref 35.0–45.0)
Hemoglobin: 14.4 g/dL (ref 11.7–15.5)
MCH: 29 pg (ref 27.0–33.0)
MCHC: 32.9 g/dL (ref 32.0–36.0)
MCV: 88.1 fL (ref 80.0–100.0)
MPV: 9 fL (ref 7.5–12.5)
Platelets: 336 10*3/uL (ref 140–400)
RBC: 4.97 10*6/uL (ref 3.80–5.10)
RDW: 12.5 % (ref 11.0–15.0)
WBC: 6 10*3/uL (ref 3.8–10.8)

## 2019-08-09 LAB — LIPID PANEL
Cholesterol: 233 mg/dL — ABNORMAL HIGH (ref <200)
HDL: 57 mg/dL (ref >=50)
LDL Cholesterol (Calc): 151 mg/dL (calc) — ABNORMAL HIGH
Non-HDL Cholesterol (Calc): 176 mg/dL (calc) — ABNORMAL HIGH (ref <130)
Total CHOL/HDL Ratio: 4.1 (calc) (ref <5.0)
Triglycerides: 125 mg/dL (ref <150)

## 2019-08-09 LAB — VITAMIN D 25 HYDROXY (VIT D DEFICIENCY, FRACTURES): Vit D, 25-Hydroxy: 18 ng/mL — ABNORMAL LOW (ref 30–100)

## 2019-08-09 LAB — TSH: TSH: 0.61 mIU/L

## 2019-08-10 ENCOUNTER — Encounter: Payer: Self-pay | Admitting: Obstetrics & Gynecology

## 2019-08-10 NOTE — Patient Instructions (Signed)
1. Well female exam with routine gynecological exam Normal gynecologic exam.  Pap test August 2019 was negative, no indication to repeat this year.  Breast exam normal.  Screening mammogram to schedule now.  Fasting health labs here today. - CBC - Comp Met (CMET) - TSH - Lipid panel - VITAMIN D 25 Hydroxy (Vit-D Deficiency, Fractures)  2. Relies on partner vasectomy for contraception  3. Class 2 obesity due to excess calories without serious comorbidity with body mass index (BMI) of 39.0 to 39.9 in adult Recommend lower calorie/carb diet such as [employer].  Aerobic physical activities 5 times a week and weightlifting every 2 days.  Crystal Colon, it was a pleasure seeing you today!  I will inform you of your results as soon as they are available.

## 2019-08-13 ENCOUNTER — Other Ambulatory Visit: Payer: Self-pay | Admitting: Obstetrics & Gynecology

## 2019-08-13 DIAGNOSIS — Z1231 Encounter for screening mammogram for malignant neoplasm of breast: Secondary | ICD-10-CM

## 2019-08-14 ENCOUNTER — Other Ambulatory Visit: Payer: Self-pay | Admitting: Anesthesiology

## 2019-08-14 MED ORDER — VITAMIN D (ERGOCALCIFEROL) 1.25 MG (50000 UNIT) PO CAPS: 50000.0000 [IU] | ORAL_CAPSULE | ORAL | 0 refills | Status: DC

## 2019-08-19 ENCOUNTER — Other Ambulatory Visit: Payer: Self-pay

## 2019-08-19 DIAGNOSIS — E559 Vitamin D deficiency, unspecified: Secondary | ICD-10-CM

## 2019-09-26 ENCOUNTER — Ambulatory Visit
Admission: RE | Admit: 2019-09-26 | Discharge: 2019-09-26 | Disposition: A | Discharge status: Home / Self Care | Payer: 59 | Source: Ambulatory Visit | Attending: Obstetrics & Gynecology | Admitting: Obstetrics & Gynecology

## 2019-09-26 ENCOUNTER — Other Ambulatory Visit: Payer: Self-pay

## 2019-09-26 DIAGNOSIS — Z1231 Encounter for screening mammogram for malignant neoplasm of breast: Secondary | ICD-10-CM

## 2019-10-30 ENCOUNTER — Other Ambulatory Visit: Payer: Self-pay | Admitting: Obstetrics & Gynecology

## 2020-04-07 ENCOUNTER — Ambulatory Visit: Payer: 59 | Admitting: Internal Medicine

## 2020-04-07 ENCOUNTER — Other Ambulatory Visit: Payer: Self-pay

## 2020-04-07 ENCOUNTER — Encounter: Payer: Self-pay | Admitting: Internal Medicine

## 2020-04-07 DIAGNOSIS — E785 Hyperlipidemia, unspecified: Secondary | ICD-10-CM | POA: Diagnosis not present

## 2020-04-07 DIAGNOSIS — I10 Essential (primary) hypertension: Secondary | ICD-10-CM | POA: Diagnosis not present

## 2020-04-07 NOTE — Patient Instructions (Signed)
-  Nice seeing you today!!  -Schedule follow up for your annual physical in 6 months. Please come in fasting that day.

## 2020-04-07 NOTE — Progress Notes (Signed)
New Patient Office Visit     This visit occurred during the SARS-CoV-2 public health emergency.  Safety protocols were in place, including screening questions prior to the visit, additional usage of staff PPE, and extensive cleaning of exam room while observing appropriate contact time as indicated for disinfecting solutions.    CC/Reason for Visit: Establish care, discuss chronic medical conditions Previous PCP: GYN Last Visit: Late summer 2020  HPI: Crystal Colon is a 44 y.o. female who is coming in today for the above mentioned reasons. Past Medical History is significant for: Morbid obesity with a BMI above 40, hyperlipidemia not on medications.  She has no acute complaints today.  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  She is married, she has 2 children ages 2 and 67.  Her past surgical history is only significant for 2 C-sections.  She has no known drug allergies, takes no routine medications, she does not smoke, she drinks alcohol occasionally.  She has no family history of significance.  She had lipids drawn by her GYN in August 2020 and was found to have an LDL of 151.   Past Medical/Surgical History: Past Medical History:  Diagnosis Date  . Depression   . Ectopic pregnancy    right--- treated with methotrexate  . Ectopic pregnancy 01/2010   left--- treated with methotrexate   . H. pylori infection    2013 treated  . Hyperlipidemia   . Hypertension 2012  . Morbid obesity ([facility])     Past Surgical History:  Procedure Laterality Date  . CESAREAN SECTION     x2    Social History:  reports that she has never smoked. She has never used smokeless tobacco. She reports current alcohol use. She reports that she does not use drugs.  Allergies: No Known Allergies  Family History:  Family History  Problem Relation Age of Onset  . Hypertension Mother      Current Outpatient Medications:  .  ibuprofen (ADVIL) 800 MG tablet, Take by mouth., Disp: ,  Rfl:   Review of Systems:  Constitutional: Denies fever, chills, diaphoresis, appetite change and fatigue.  HEENT: Denies photophobia, eye pain, redness, hearing loss, ear pain, congestion, sore throat, rhinorrhea, sneezing, mouth sores, trouble swallowing, neck pain, neck stiffness and tinnitus.   Respiratory: Denies SOB, DOE, cough, chest tightness,  and wheezing.   Cardiovascular: Denies chest pain, palpitations and leg swelling.  Gastrointestinal: Denies nausea, vomiting, abdominal pain, diarrhea, constipation, blood in stool and abdominal distention.  Genitourinary: Denies dysuria, urgency, frequency, hematuria, flank pain and difficulty urinating.  Endocrine: Denies: hot or cold intolerance, sweats, changes in hair or nails, polyuria, polydipsia. Musculoskeletal: Denies myalgias, back pain, joint swelling, arthralgias and gait problem.  Skin: Denies pallor, rash and wound.  Neurological: Denies dizziness, seizures, syncope, weakness, light-headedness, numbness and headaches.  Hematological: Denies adenopathy. Easy bruising, personal or family bleeding history  Psychiatric/Behavioral: Denies suicidal ideation, mood changes, confusion, nervousness, sleep disturbance and agitation    Physical Exam: Vitals:   04/07/20 0804  BP: 110/70  Pulse: 69  Temp: (!) 97.2 F (36.2 C)  TempSrc: Temporal  SpO2: 98%  Weight: 220 lb (99.8 kg)  Height: 5\' 2"  (1.575 m)   Body mass index is 40.24 kg/m.  Constitutional: NAD, calm, comfortable, obese Eyes: PERRL, lids and conjunctivae normal, wears corrective lenses ENMT: Mucous membranes are moist.  Respiratory: clear to auscultation bilaterally, no wheezing, no crackles. Normal respiratory effort. No accessory muscle use.  Cardiovascular: Regular  rate and rhythm, no murmurs / rubs / gallops. No extremity edema. Neurologic: Grossly intact and nonfocal Psychiatric: Normal judgment and insight. Alert and oriented x 3. Normal mood.     Impression and Plan:  Essential hypertension -Well-controlled, not currently on medications  Hyperlipidemia, unspecified hyperlipidemia type -LDL in August 2020 was 151. -We will do a 25-month trial of lifestyle modifications.  Morbid obesity ([place]) -Discussed healthy lifestyle, including increased physical activity and better food choices to promote weight loss.    Patient Instructions  -Nice seeing you today!!  -Schedule follow up for your annual physical in 6 months. Please come in fasting that day.     Lelon Frohlich, MD [facility]

## 2020-08-13 ENCOUNTER — Ambulatory Visit: Payer: 59 | Admitting: Obstetrics & Gynecology

## 2020-08-13 ENCOUNTER — Other Ambulatory Visit: Payer: Self-pay

## 2020-08-13 ENCOUNTER — Encounter: Payer: Self-pay | Admitting: Obstetrics & Gynecology

## 2020-08-13 VITALS — BP 130/80 | Ht 61.25 in | Wt 220.0 lb

## 2020-08-13 DIAGNOSIS — Z01419 Encounter for gynecological examination (general) (routine) without abnormal findings: Secondary | ICD-10-CM

## 2020-08-13 DIAGNOSIS — Z9189 Other specified personal risk factors, not elsewhere classified: Secondary | ICD-10-CM | POA: Diagnosis not present

## 2020-08-13 DIAGNOSIS — Z6841 Body Mass Index (BMI) 40.0 and over, adult: Secondary | ICD-10-CM | POA: Diagnosis not present

## 2020-08-13 NOTE — Progress Notes (Signed)
Crystal Colon 12-22-1975 [id number]   History:    44 y.o. G66P2A2L2 Married.  Vasectomy.  Daughter 76 yo, son 20 yo.  RP:  Established patient presenting for annual gyn exam   HPI: Menstrual periods every month with normal flow.  Mild dysmenorrhea controled with Ibuprofen.  No breakthrough bleeding.  No pelvic pain.  No pain with intercourse.  Urine and bowel movements normal.  Breasts normal.  Body mass index 41.23.  Needs to exercise more and reduce calories.  Fasting health labs here today.  Past medical history,surgical history, family history and social history were all reviewed and documented in the EPIC chart.  Gynecologic History Patient's last menstrual period was 08/13/2020.  Obstetric History OB History  Gravida Para Term Preterm AB Living  '4 2 2   2 2  ' SAB TAB Ectopic Multiple Live Births      2   2    # Outcome Date GA Lbr Len/2nd Weight Sex Delivery Anes PTL Lv  4 Ectopic           3 Ectopic           2 Term     F CS-Unspec  N LIV  1 Term     M CS-Unspec  N LIV     ROS: A ROS was performed and pertinent positives and negatives are included in the history.  GENERAL: No fevers or chills. HEENT: No change in vision, no earache, sore throat or sinus congestion. NECK: No pain or stiffness. CARDIOVASCULAR: No chest pain or pressure. No palpitations. PULMONARY: No shortness of breath, cough or wheeze. GASTROINTESTINAL: No abdominal pain, nausea, vomiting or diarrhea, melena or bright red blood per rectum. GENITOURINARY: No urinary frequency, urgency, hesitancy or dysuria. MUSCULOSKELETAL: No joint or muscle pain, no back pain, no recent trauma. DERMATOLOGIC: No rash, no itching, no lesions. ENDOCRINE: No polyuria, polydipsia, no heat or cold intolerance. No recent change in weight. HEMATOLOGICAL: No anemia or easy bruising or bleeding. NEUROLOGIC: No headache, seizures, numbness, tingling or weakness. PSYCHIATRIC: No depression, no loss of interest in normal  activity or change in sleep pattern.     Exam:   BP 130/80   Ht 5' 1.25" (1.556 m)   Wt 220 lb (99.8 kg)   LMP 08/13/2020   BMI 41.23 kg/m   Body mass index is 41.23 kg/m.  General appearance : Well developed well nourished female. No acute distress HEENT: Eyes: no retinal hemorrhage or exudates,  Neck supple, trachea midline, no carotid bruits, no thyroidmegaly Lungs: Clear to auscultation, no rhonchi or wheezes, or rib retractions  Heart: Regular rate and rhythm, no murmurs or gallops Breast:Examined in sitting and supine position were symmetrical in appearance, no palpable masses or tenderness,  no skin retraction, no nipple inversion, no nipple discharge, no skin discoloration, no axillary or supraclavicular lymphadenopathy Abdomen: no palpable masses or tenderness, no rebound or guarding Extremities: no edema or skin discoloration or tenderness  Pelvic: Vulva: Normal             Vagina: No gross lesions or discharge  Cervix: No gross lesions or discharge.  Pap reflex done.  Uterus  AV, normal size, shape and consistency, non-tender and mobile  Adnexa  Without masses or tenderness  Anus: Normal   Assessment/Plan:  44 y.o. female for annual exam   1. Encounter for routine gynecological examination with Papanicolaou smear of cervix Normal gynecologic exam. Pap reflex done. Breast exam normal. Screening mammogram Oct 2020 was negative. Fasting  health labs here today. - CBC - Comp Met (CMET) - TSH - Lipid panel - VITAMIN D 25 Hydroxy (Vit-D Deficiency, Fractures)  2. Relies on partner vasectomy for contraception  3. Class 3 severe obesity due to excess calories without serious comorbidity with body mass index (BMI) of 40.0 to 44.9 in adult [facility]) Low calorie/carb diet discussed with patient. Will decrease portions. Aerobic activities 5 times a week with light weightlifting every 2 days recommended.  Princess Bruins MD, 8:44 AM 08/13/2020

## 2020-08-13 NOTE — Addendum Note (Signed)
Addended by: Berna Spare A on: 08/13/2020 10:31 AM   Modules accepted: Orders

## 2020-08-14 LAB — CBC
HCT: 41.6 % (ref 35.0–45.0)
Hemoglobin: 13.6 g/dL (ref 11.7–15.5)
MCH: 28.6 pg (ref 27.0–33.0)
MCHC: 32.7 g/dL (ref 32.0–36.0)
MCV: 87.4 fL (ref 80.0–100.0)
MPV: 9.7 fL (ref 7.5–12.5)
Platelets: 328 10*3/uL (ref 140–400)
RBC: 4.76 10*6/uL (ref 3.80–5.10)
RDW: 12.6 % (ref 11.0–15.0)
WBC: 5.4 10*3/uL (ref 3.8–10.8)

## 2020-08-14 LAB — COMPREHENSIVE METABOLIC PANEL
AG Ratio: 1.4 (calc) (ref 1.0–2.5)
ALT: 12 U/L (ref 6–29)
AST: 11 U/L (ref 10–30)
Albumin: 4 g/dL (ref 3.6–5.1)
Alkaline phosphatase (APISO): 72 U/L (ref 31–125)
BUN/Creatinine Ratio: 24 (calc) — ABNORMAL HIGH (ref 6–22)
BUN: 12 mg/dL (ref 7–25)
CO2: 22 mmol/L (ref 20–32)
Calcium: 9.1 mg/dL (ref 8.6–10.2)
Chloride: 107 mmol/L (ref 98–110)
Creat: 0.49 mg/dL — ABNORMAL LOW (ref 0.50–1.10)
Globulin: 2.9 g/dL (calc) (ref 1.9–3.7)
Glucose, Bld: 101 mg/dL — ABNORMAL HIGH (ref 65–99)
Potassium: 3.9 mmol/L (ref 3.5–5.3)
Sodium: 138 mmol/L (ref 135–146)
Total Bilirubin: 0.7 mg/dL (ref 0.2–1.2)
Total Protein: 6.9 g/dL (ref 6.1–8.1)

## 2020-08-14 LAB — LIPID PANEL
Cholesterol: 207 mg/dL — ABNORMAL HIGH (ref <200)
HDL: 63 mg/dL (ref >=50)
LDL Cholesterol (Calc): 125 mg/dL (calc) — ABNORMAL HIGH
Non-HDL Cholesterol (Calc): 144 mg/dL (calc) — ABNORMAL HIGH (ref <130)
Total CHOL/HDL Ratio: 3.3 (calc) (ref <5.0)
Triglycerides: 90 mg/dL (ref <150)

## 2020-08-14 LAB — VITAMIN D 25 HYDROXY (VIT D DEFICIENCY, FRACTURES): Vit D, 25-Hydroxy: 14 ng/mL — ABNORMAL LOW (ref 30–100)

## 2020-08-14 LAB — TSH: TSH: 0.87 mIU/L

## 2020-08-17 ENCOUNTER — Other Ambulatory Visit: Payer: Self-pay

## 2020-08-17 ENCOUNTER — Other Ambulatory Visit: Payer: Self-pay | Admitting: Anesthesiology

## 2020-08-17 DIAGNOSIS — R7309 Other abnormal glucose: Secondary | ICD-10-CM

## 2020-08-17 DIAGNOSIS — E559 Vitamin D deficiency, unspecified: Secondary | ICD-10-CM

## 2020-08-17 MED ORDER — VITAMIN D (ERGOCALCIFEROL) 1.25 MG (50000 UNIT) PO CAPS: 50000.0000 [IU] | ORAL_CAPSULE | ORAL | 0 refills | Status: DC

## 2020-08-19 LAB — PAP IG W/ RFLX HPV ASCU

## 2020-11-03 ENCOUNTER — Other Ambulatory Visit: Payer: Self-pay | Admitting: Obstetrics & Gynecology

## 2021-03-29 ENCOUNTER — Other Ambulatory Visit: Payer: Self-pay

## 2021-03-29 ENCOUNTER — Ambulatory Visit
Admission: EM | Admit: 2021-03-29 | Discharge: 2021-03-29 | Disposition: A | Discharge status: Home / Self Care | Payer: 59 | Attending: Family Medicine | Admitting: Family Medicine

## 2021-03-29 ENCOUNTER — Encounter: Payer: Self-pay | Admitting: Emergency Medicine

## 2021-03-29 DIAGNOSIS — R42 Dizziness and giddiness: Secondary | ICD-10-CM | POA: Diagnosis not present

## 2021-03-29 DIAGNOSIS — H9202 Otalgia, left ear: Secondary | ICD-10-CM | POA: Diagnosis not present

## 2021-03-29 DIAGNOSIS — H6982 Other specified disorders of Eustachian tube, left ear: Secondary | ICD-10-CM | POA: Diagnosis not present

## 2021-03-29 MED ORDER — HYDROCORTISONE 1 % EX OINT: 1.0000 "application " | TOPICAL_OINTMENT | Freq: Two times a day (BID) | CUTANEOUS | 0 refills | Status: DC

## 2021-03-29 MED ORDER — PREDNISONE 20 MG PO TABS: 20.0000 mg | ORAL_TABLET | Freq: Every day | ORAL | 0 refills | Status: AC

## 2021-03-29 MED ORDER — IPRATROPIUM BROMIDE 0.03 % NA SOLN: 2.0000 | Freq: Three times a day (TID) | NASAL | 0 refills | Status: DC | PRN

## 2021-03-29 NOTE — ED Triage Notes (Signed)
Pt presents with dizziness on left side of head that started around 4 am today. States left ear pain started yesterday. States before pain, ear was very itchy.

## 2021-03-29 NOTE — Discharge Instructions (Addendum)
Take prednisone 20 mg with food daily for 5 days.  Use Atrovent nasal spray 3 times daily for the next 5 days and then resume to as needed.  For inner ear itching hydrocortisone apply small amount to a Q-tip and apply directly to your canal to reduce ear canal itching.

## 2021-03-29 NOTE — ED Provider Notes (Signed)
[facility]    CSN: [id number] Arrival date & time: 03/29/21  0804      History   Chief Complaint Chief Complaint  Patient presents with  . Dizziness    Left side  . Otalgia    HPI Crystal Colon is a 45 y.o. female.   HPI  Patient presents today with acute onset left ear pain.  Patient endorses having intermittent ear itchiness of the ear canal.  She reports that she experienced dizziness while lying on her left side and with turning of her head.  She is afebrile.  Denies any URI symptoms.  Past Medical History:  Diagnosis Date  . Depression   . Ectopic pregnancy    right--- treated with methotrexate  . Ectopic pregnancy 01/2010   left--- treated with methotrexate   . H. pylori infection    2013 treated  . Hyperlipidemia   . Hypertension 2012  . Morbid obesity [facility])     Patient Active Problem List   Diagnosis Date Noted  . Hyperlipidemia   . Morbid obesity (HCC)   . Menorrhagia with regular cycle 08/13/2015  . HTN (hypertension) 01/29/2012  . Abdominal pain, chronic, epigastric 01/29/2012  . Hypertension 2012  . IRREGULAR MENSES 10/23/2007    Past Surgical History:  Procedure Laterality Date  . CESAREAN SECTION     x2    OB History    Gravida  4   Para  2   Term  2   Preterm      AB  2   Living  2     SAB      IAB      Ectopic  2   Multiple      Live Births  2            Home Medications    Prior to Admission medications   Medication Sig Start Date End Date Taking? Authorizing Provider  hydrocortisone 1 % ointment Apply 1 application topically 2 (two) times daily. 03/29/21  Yes Bing Neighbors, FNP  ipratropium (ATROVENT) 0.03 % nasal spray Place 2 sprays into both nostrils 3 (three) times daily as needed for rhinitis. 03/29/21  Yes Bing Neighbors, FNP  predniSONE (DELTASONE) 20 MG tablet Take 1 tablet (20 mg total) by mouth daily with breakfast for 5 days. 03/29/21 04/03/21 Yes Bing Neighbors, FNP   ibuprofen (ADVIL) 800 MG tablet Take by mouth. 09/01/16   [provider]  Vitamin D, Ergocalciferol, (DRISDOL) 1.25 MG (50000 UNIT) CAPS capsule Take 1 capsule (50,000 Units total) by mouth every 7 (seven) days. 08/17/20   Genia Del, MD    Family History Family History  Problem Relation Age of Onset  . Hypertension Mother     Social History Social History   Tobacco Use  . Smoking status: Never Smoker  . Smokeless tobacco: Never Used  Vaping Use  . Vaping Use: Never used  Substance Use Topics  . Alcohol use: Yes    Alcohol/week: 0.0 standard drinks    Comment: occasional  . Drug use: No     Allergies   Patient has no known allergies.   Review of Systems Review of Systems Pertinent negatives listed in HPI  Physical Exam Triage Vital Signs ED Triage Vitals  Enc Vitals Group     BP 03/29/21 0820 129/86     Pulse Rate 03/29/21 0820 81     Resp 03/29/21 0820 16     Temp 03/29/21 0820 98 F (36.7  C)     Temp Source 03/29/21 0820 Oral     SpO2 03/29/21 0820 98 %     Weight --      Height --      Head Circumference --      Peak Flow --      Pain Score 03/29/21 0819 0     Pain Loc --      Pain Edu? --      Excl. in GC? --    No data found.  Updated Vital Signs BP 129/86 (BP Location: Left Arm)   Pulse 81   Temp 98 F (36.7 C) (Oral)   Resp 16   LMP 03/17/2021   SpO2 98%   Visual Acuity Right Eye Distance:   Left Eye Distance:   Bilateral Distance:    Right Eye Near:   Left Eye Near:    Bilateral Near:     Physical Exam HENT:     Head: Normocephalic and atraumatic.     Right Ear: Hearing, tympanic membrane, ear canal and external ear normal.     Left Ear: Hearing and external ear normal. A middle ear effusion is present.     Ears:     Comments: Canal with a dry peeling skin present Cardiovascular:     Rate and Rhythm: Normal rate and regular rhythm.  Pulmonary:     Effort: Pulmonary effort is normal.     Breath sounds: Normal  breath sounds.  Neurological:     General: No focal deficit present.     Mental Status: She is alert.     GCS: GCS eye subscore is 4. GCS verbal subscore is 5. GCS motor subscore is 6.  Psychiatric:        Mood and Affect: Mood normal.        Behavior: Behavior normal.        Thought Content: Thought content normal.        Judgment: Judgment normal.      UC Treatments / Results  Labs (all labs ordered are listed, but only abnormal results are displayed) Labs Reviewed - No data to display  EKG   Radiology No results found.  Procedures Procedures (including critical care time)  Medications Ordered in UC Medications - No data to display  Initial Impression / Assessment and Plan / UC Course  I have reviewed the triage vital signs and the nursing notes.  Pertinent labs & imaging results that were available during my care of the patient were reviewed by me and considered in my medical decision making (see chart for details).     Left ear pain, consistent with that of left eustachian tube dysfunction subsequently resulting in dizziness.  Treating with prednisone 20 mg once daily for 5 days, Atrovent nasal spray 3 times daily for the next 5 days then use as needed.  For inner ear itching hydrocortisone cream to apply directly to the canal. Follow-up with PCP as needed  Final Clinical Impressions(s) / UC Diagnoses   Final diagnoses:  Otalgia, left  Dysfunction of left eustachian tube  Dizziness     Discharge Instructions     Take prednisone 20 mg with food daily for 5 days.  Use Atrovent nasal spray 3 times daily for the next 5 days and then resume to as needed.  For inner ear itching hydrocortisone apply small amount to a Q-tip and apply directly to your canal to reduce ear canal itching.    ED Prescriptions  Medication Sig Dispense Auth. Provider   predniSONE (DELTASONE) 20 MG tablet Take 1 tablet (20 mg total) by mouth daily with breakfast for 5 days. 5 tablet  Bing Neighbors, FNP   ipratropium (ATROVENT) 0.03 % nasal spray Place 2 sprays into both nostrils 3 (three) times daily as needed for rhinitis. 30 mL Bing Neighbors, FNP   hydrocortisone 1 % ointment Apply 1 application topically 2 (two) times daily. 30 g Bing Neighbors, FNP     PDMP not reviewed this encounter.   Bing Neighbors, [place] 03/29/21 [phone]

## 2021-04-20 ENCOUNTER — Other Ambulatory Visit: Payer: Self-pay | Admitting: Family Medicine

## 2021-06-23 ENCOUNTER — Encounter: Payer: Self-pay | Admitting: Internal Medicine

## 2021-06-23 ENCOUNTER — Ambulatory Visit: Payer: 59 | Admitting: Internal Medicine

## 2021-06-23 ENCOUNTER — Other Ambulatory Visit: Payer: Self-pay

## 2021-06-23 VITALS — BP 120/80 | HR 79 | Temp 98.0°F | Wt 217.2 lb

## 2021-06-23 DIAGNOSIS — Z23 Encounter for immunization: Secondary | ICD-10-CM

## 2021-06-23 DIAGNOSIS — R42 Dizziness and giddiness: Secondary | ICD-10-CM

## 2021-06-23 DIAGNOSIS — H9201 Otalgia, right ear: Secondary | ICD-10-CM | POA: Diagnosis not present

## 2021-06-23 DIAGNOSIS — H66001 Acute suppurative otitis media without spontaneous rupture of ear drum, right ear: Secondary | ICD-10-CM | POA: Diagnosis not present

## 2021-06-23 MED ORDER — AMOXICILLIN-POT CLAVULANATE 875-125 MG PO TABS: 1.0000 | ORAL_TABLET | Freq: Two times a day (BID) | ORAL | 0 refills | Status: AC

## 2021-06-23 NOTE — Progress Notes (Signed)
Established Patient Office Visit     This visit occurred during the SARS-CoV-2 public health emergency.  Safety protocols were in place, including screening questions prior to the visit, additional usage of staff PPE, and extensive cleaning of exam room while observing appropriate contact time as indicated for disinfecting solutions.    CC/Reason for Visit: Dizziness, right ear pain  HPI: Crystal Colon is a 45 y.o. female who is coming in today for the above mentioned reasons. Past Medical History is significant for: Morbid obesity, hyperlipidemia and vitamin D deficiency.  She started having vertigo in April.  Went to urgent care and at that time was diagnosed with a left stacking tube dysfunction and given prednisone which improved symptoms for some time.  She has continued to have vertigo and now right-sided ear pain.  No syncopal episodes, no nausea, no vomiting.  These are rare and happen maybe once every 2 weeks.   Past Medical/Surgical History: Past Medical History:  Diagnosis Date   Depression    Ectopic pregnancy    right--- treated with methotrexate   Ectopic pregnancy 01/2010   left--- treated with methotrexate    H. pylori infection    2013 treated   Hyperlipidemia    Hypertension 2012   Morbid obesity (HCC)     Past Surgical History:  Procedure Laterality Date   CESAREAN SECTION     x2    Social History:  reports that she has never smoked. She has never used smokeless tobacco. She reports current alcohol use. She reports that she does not use drugs.  Allergies: No Known Allergies  Family History:  Family History  Problem Relation Age of Onset   Hypertension Mother      Current Outpatient Medications:    amoxicillin-clavulanate (AUGMENTIN) 875-125 MG tablet, Take 1 tablet by mouth 2 (two) times daily for 7 days., Disp: 14 tablet, Rfl: 0   hydrocortisone 1 % ointment, Apply 1 application topically 2 (two) times daily., Disp: 30 g, Rfl: 0    ibuprofen (ADVIL) 800 MG tablet, Take by mouth., Disp: , Rfl:    ipratropium (ATROVENT) 0.03 % nasal spray, Place 2 sprays into both nostrils 3 (three) times daily as needed for rhinitis., Disp: 30 mL, Rfl: 0   Vitamin D, Ergocalciferol, (DRISDOL) 1.25 MG (50000 UNIT) CAPS capsule, Take 1 capsule (50,000 Units total) by mouth every 7 (seven) days., Disp: 12 capsule, Rfl: 0  Review of Systems:  Constitutional: Denies fever, chills, diaphoresis, appetite change and fatigue.  HEENT: Denies photophobia, eye pain, redness, hearing loss, ear pain, congestion, sore throat, rhinorrhea, sneezing, mouth sores, trouble swallowing, neck pain, neck stiffness and tinnitus.   Respiratory: Denies SOB, DOE, cough, chest tightness,  and wheezing.   Cardiovascular: Denies chest pain, palpitations and leg swelling.  Gastrointestinal: Denies nausea, vomiting, abdominal pain, diarrhea, constipation, blood in stool and abdominal distention.  Genitourinary: Denies dysuria, urgency, frequency, hematuria, flank pain and difficulty urinating.  Endocrine: Denies: hot or cold intolerance, sweats, changes in hair or nails, polyuria, polydipsia. Musculoskeletal: Denies myalgias, back pain, joint swelling, arthralgias and gait problem.  Skin: Denies pallor, rash and wound.  Neurological: Denies  seizures, syncope, weakness, numbness and headaches.  Hematological: Denies adenopathy. Easy bruising, personal or family bleeding history  Psychiatric/Behavioral: Denies suicidal ideation, mood changes, confusion, nervousness, sleep disturbance and agitation    Physical Exam: Vitals:   06/23/21 0823  BP: 120/80  Pulse: 79  Temp: 98 F (36.7 C)  TempSrc: Oral  SpO2: 99%  Weight: 217 lb 3.2 oz (98.5 kg)    Body mass index is 40.71 kg/m.   Constitutional: NAD, calm, comfortable Eyes: PERRL, lids and conjunctivae normal ENMT: Mucous membranes are moist. Posterior pharynx clear is erythematous, no exudate or lesions.   Tympanic membrane is red on the left, on the right there is some purulence and air-fluid levels. Neck: normal, supple, no masses, no thyromegaly Respiratory: clear to auscultation bilaterally, no wheezing, no crackles. Normal respiratory effort. No accessory muscle use.  Cardiovascular: Regular rate and rhythm, no murmurs / rubs / gallops. No extremity edema.  Neurologic: Grossly intact and nonfocal Psychiatric: Normal judgment and insight. Alert and oriented x 3. Normal mood.    Impression and Plan:  Vertigo  Right ear pain - Plan: amoxicillin-clavulanate (AUGMENTIN) 875-125 MG tablet  Need for Tdap vaccination  Non-recurrent acute suppurative otitis media of right ear without spontaneous rupture of tympanic membrane  -She certainly has what appears to be her right ear suppurative otitis media.  Unclear if this is contributing to her vertigo or not. -In any case I will treat with Augmentin for 7 days.  She has also been advised to take a daily antihistamine and Mucinex twice daily in addition to saline nasal spray. -If vertigo continues after treatment for her acute otitis media, can consider referral to physical therapy for vestibular therapy.  -She is requesting Tdap vaccine that has been administered today.  Time spent: 31 minutes reviewing chart, interviewing and examining patient and formulating plan of care.   Patient Instructions  -Augmentin 875 mg 1 capsula dos veces al dia por 7 dias.  -Sin receta medica: claritin 1 tableta al dia, mucinex 1200 mg 1 tablet dos veces al dia.   Chaya Jan, MD [facility]

## 2021-06-23 NOTE — Addendum Note (Signed)
Addended by: Kern Reap B on: 06/23/2021 11:56 AM   Modules accepted: Orders

## 2021-06-23 NOTE — Patient Instructions (Signed)
-  Augmentin 875 mg 1 capsula dos veces al dia por 7 dias.  -Sin receta medica: claritin 1 tableta al dia, mucinex 1200 mg 1 tablet dos veces al dia.

## 2021-08-18 ENCOUNTER — Encounter: Payer: Self-pay | Admitting: Obstetrics & Gynecology

## 2021-08-18 ENCOUNTER — Other Ambulatory Visit: Payer: Self-pay

## 2021-08-18 ENCOUNTER — Ambulatory Visit (INDEPENDENT_AMBULATORY_CARE_PROVIDER_SITE_OTHER): Payer: 59 | Admitting: Obstetrics & Gynecology

## 2021-08-18 VITALS — BP 110/70 | HR 73 | Resp 16 | Ht 61.5 in | Wt 219.0 lb

## 2021-08-18 DIAGNOSIS — Z01419 Encounter for gynecological examination (general) (routine) without abnormal findings: Secondary | ICD-10-CM | POA: Diagnosis not present

## 2021-08-18 DIAGNOSIS — Z9189 Other specified personal risk factors, not elsewhere classified: Secondary | ICD-10-CM | POA: Diagnosis not present

## 2021-08-18 DIAGNOSIS — Z6841 Body Mass Index (BMI) 40.0 and over, adult: Secondary | ICD-10-CM | POA: Diagnosis not present

## 2021-08-18 NOTE — Progress Notes (Signed)
Crystal Colon Mar 05, 1976 [id number]   History:    45 y.o. G27P2A2L2 Married.  Vasectomy.  Daughter 95 yo, son 32 yo.   RP:  Established patient presenting for annual gyn exam    HPI: Menstrual periods every month with normal flow.  Mild dysmenorrhea controled with Ibuprofen.  No breakthrough bleeding.  No pelvic pain.  No pain with intercourse.  Urine and bowel movements normal.  Breasts normal.  Body mass index 40.71.  Active at work, Financial planner.  Needs to exercise more and reduce calories.  Fasting health labs here today.   Past medical history,surgical history, family history and social history were all reviewed and documented in the EPIC chart.  Gynecologic History Patient's last menstrual period was 07/22/2021 (exact date).  Obstetric History OB History  Gravida Para Term Preterm AB Living  '4 2 2   2 2  ' SAB IAB Ectopic Multiple Live Births      2   2    # Outcome Date GA Lbr Len/2nd Weight Sex Delivery Anes PTL Lv  4 Ectopic           3 Ectopic           2 Term     F CS-Unspec  N LIV  1 Term     M CS-Unspec  N LIV     ROS: A ROS was performed and pertinent positives and negatives are included in the history.  GENERAL: No fevers or chills. HEENT: No change in vision, no earache, sore throat or sinus congestion. NECK: No pain or stiffness. CARDIOVASCULAR: No chest pain or pressure. No palpitations. PULMONARY: No shortness of breath, cough or wheeze. GASTROINTESTINAL: No abdominal pain, nausea, vomiting or diarrhea, melena or bright red blood per rectum. GENITOURINARY: No urinary frequency, urgency, hesitancy or dysuria. MUSCULOSKELETAL: No joint or muscle pain, no back pain, no recent trauma. DERMATOLOGIC: No rash, no itching, no lesions. ENDOCRINE: No polyuria, polydipsia, no heat or cold intolerance. No recent change in weight. HEMATOLOGICAL: No anemia or easy bruising or bleeding. NEUROLOGIC: No headache, seizures, numbness, tingling or weakness. PSYCHIATRIC: No  depression, no loss of interest in normal activity or change in sleep pattern.     Exam:   BP 110/70   Pulse 73   Resp 16   Ht 5' 1.5" (1.562 m)   Wt 219 lb (99.3 kg)   LMP 07/22/2021 (Exact Date)   BMI 40.71 kg/m   Body mass index is 40.71 kg/m.  General appearance : Well developed well nourished female. No acute distress HEENT: Eyes: no retinal hemorrhage or exudates,  Neck supple, trachea midline, no carotid bruits, no thyroidmegaly Lungs: Clear to auscultation, no rhonchi or wheezes, or rib retractions  Heart: Regular rate and rhythm, no murmurs or gallops Breast:Examined in sitting and supine position were symmetrical in appearance, no palpable masses or tenderness,  no skin retraction, no nipple inversion, no nipple discharge, no skin discoloration, no axillary or supraclavicular lymphadenopathy Abdomen: no palpable masses or tenderness, no rebound or guarding Extremities: no edema or skin discoloration or tenderness  Pelvic: Vulva: Normal             Vagina: No gross lesions or discharge  Cervix: No gross lesions or discharge  Uterus  AV, normal size, shape and consistency, non-tender and mobile  Adnexa  Without masses or tenderness  Anus: Normal   Assessment/Plan:  45 y.o. female for annual exam   1. Well female exam with routine gynecological exam Normal gynecologic exam.  Pap test negative in 2021, will repeat at 2 to 3 years.  Breast exam normal.  We will schedule a screening mammogram now.  Fasting health labs here today. - CBC - Comp Met (CMET) - Lipid Profile - TSH - Vitamin D 1,25 dihydroxy  2. Relies on partner vasectomy for contraception  3. Class 3 severe obesity due to excess calories without serious comorbidity with body mass index (BMI) of 40.0 to 44.9 in adult [facility])  Recommend a lower carb/calorie diet.  Aerobic activities 5 times a week with light weight lifting every 2 days.  Princess Bruins MD, 9:25 AM 08/18/2021

## 2021-08-27 LAB — COMPREHENSIVE METABOLIC PANEL
AG Ratio: 1.4 (calc) (ref 1.0–2.5)
ALT: 13 U/L (ref 6–29)
AST: 10 U/L (ref 10–30)
Albumin: 4.1 g/dL (ref 3.6–5.1)
Alkaline phosphatase (APISO): 61 U/L (ref 31–125)
BUN: 11 mg/dL (ref 7–25)
CO2: 24 mmol/L (ref 20–32)
Calcium: 9.2 mg/dL (ref 8.6–10.2)
Chloride: 107 mmol/L (ref 98–110)
Creat: 0.53 mg/dL (ref 0.50–0.99)
Globulin: 3 g/dL (calc) (ref 1.9–3.7)
Glucose, Bld: 98 mg/dL (ref 65–99)
Potassium: 4.7 mmol/L (ref 3.5–5.3)
Sodium: 139 mmol/L (ref 135–146)
Total Bilirubin: 1.1 mg/dL (ref 0.2–1.2)
Total Protein: 7.1 g/dL (ref 6.1–8.1)

## 2021-08-27 LAB — VITAMIN D 1,25 DIHYDROXY
Vitamin D 1, 25 (OH)2 Total: 48 pg/mL (ref 18–72)
Vitamin D2 1, 25 (OH)2: 10 pg/mL
Vitamin D3 1, 25 (OH)2: 38 pg/mL

## 2021-08-27 LAB — CBC
HCT: 43.6 % (ref 35.0–45.0)
Hemoglobin: 14.5 g/dL (ref 11.7–15.5)
MCH: 29.7 pg (ref 27.0–33.0)
MCHC: 33.3 g/dL (ref 32.0–36.0)
MCV: 89.2 fL (ref 80.0–100.0)
MPV: 9.4 fL (ref 7.5–12.5)
Platelets: 353 10*3/uL (ref 140–400)
RBC: 4.89 10*6/uL (ref 3.80–5.10)
RDW: 12.4 % (ref 11.0–15.0)
WBC: 5.8 10*3/uL (ref 3.8–10.8)

## 2021-08-27 LAB — LIPID PANEL
Cholesterol: 231 mg/dL — ABNORMAL HIGH (ref <200)
HDL: 60 mg/dL (ref >=50)
LDL Cholesterol (Calc): 148 mg/dL (calc) — ABNORMAL HIGH
Non-HDL Cholesterol (Calc): 171 mg/dL (calc) — ABNORMAL HIGH (ref <130)
Total CHOL/HDL Ratio: 3.9 (calc) (ref <5.0)
Triglycerides: 111 mg/dL (ref <150)

## 2021-08-27 LAB — TSH: TSH: 0.97 mIU/L

## 2021-09-02 ENCOUNTER — Other Ambulatory Visit: Payer: Self-pay | Admitting: Obstetrics & Gynecology

## 2021-09-02 DIAGNOSIS — Z1231 Encounter for screening mammogram for malignant neoplasm of breast: Secondary | ICD-10-CM

## 2021-09-13 ENCOUNTER — Other Ambulatory Visit: Payer: Self-pay

## 2021-09-13 ENCOUNTER — Ambulatory Visit
Admission: RE | Admit: 2021-09-13 | Discharge: 2021-09-13 | Disposition: A | Discharge status: Home / Self Care | Payer: 59 | Source: Ambulatory Visit | Attending: Obstetrics & Gynecology | Admitting: Obstetrics & Gynecology

## 2021-09-13 DIAGNOSIS — Z1231 Encounter for screening mammogram for malignant neoplasm of breast: Secondary | ICD-10-CM

## 2021-12-13 IMAGING — MG MM DIGITAL SCREENING BILAT W/ TOMO AND CAD
8 series · 8 of 24 positions shown · non-contrast
Comparison: Previous exam(s).

CLINICAL DATA: Screening.

EXAM:
DIGITAL SCREENING BILATERAL MAMMOGRAM WITH TOMOSYNTHESIS AND CAD
TECHNIQUE: Bilateral screening digital craniocaudal and mediolateral oblique
mammograms were obtained. Bilateral screening digital breast
tomosynthesis was performed. The images were evaluated with
computer-aided detection.

[R CC synth-2D]
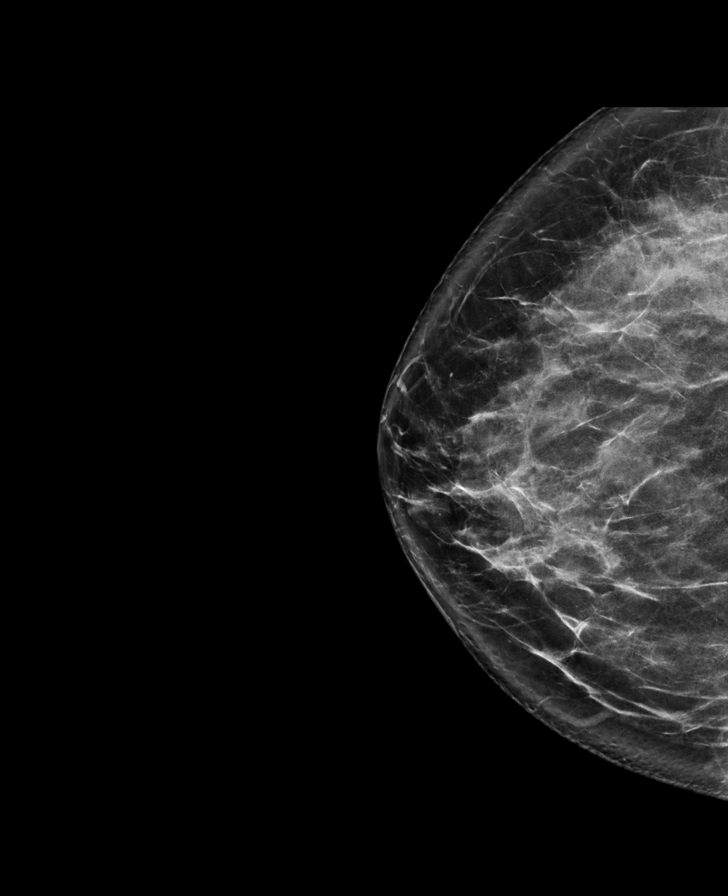

[R MLO synth-2D]
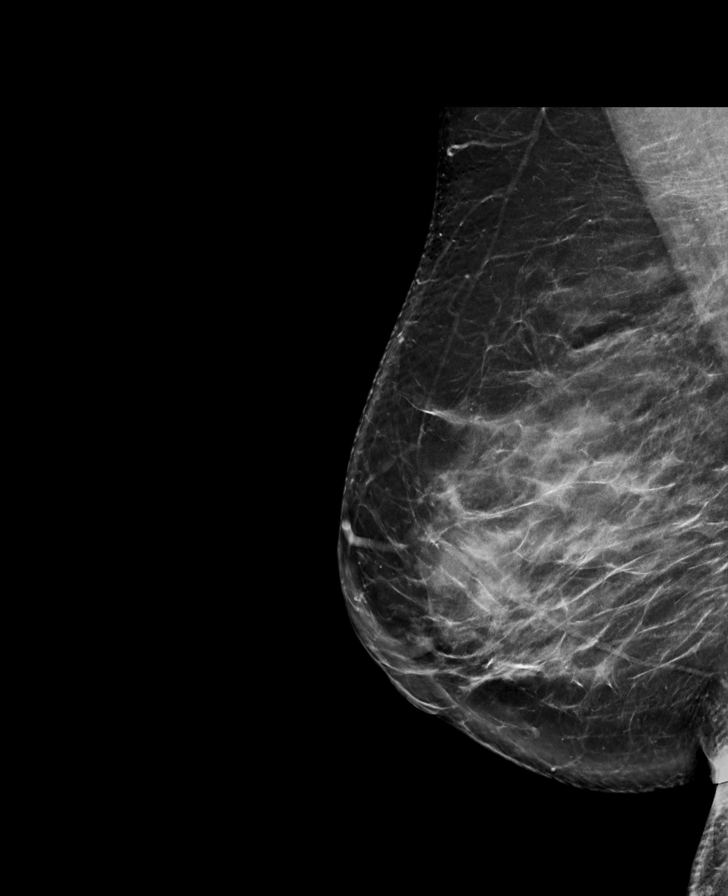

[L CC synth-2D]
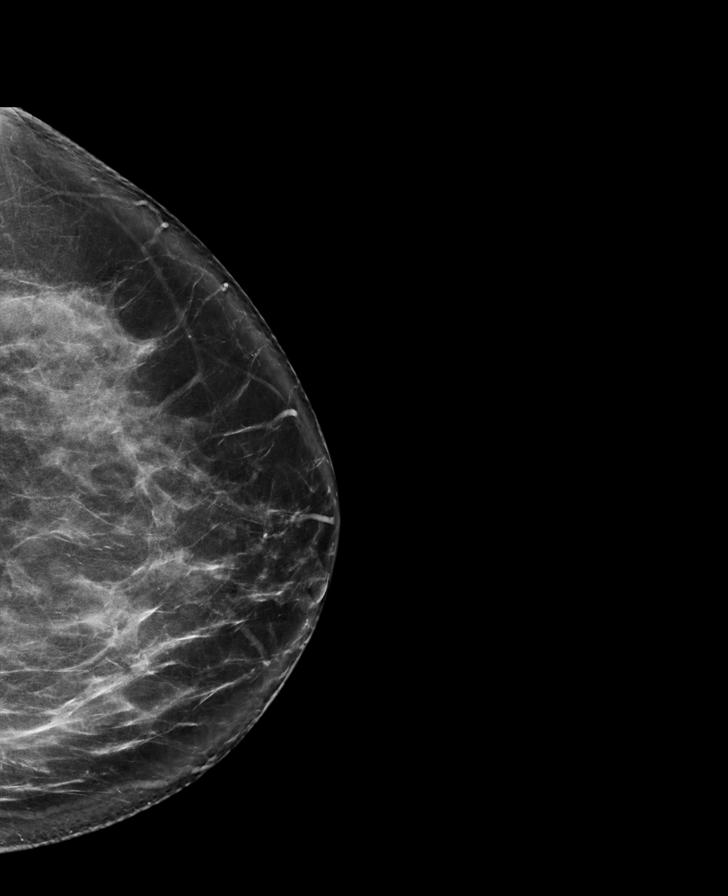

[L MLO synth-2D]
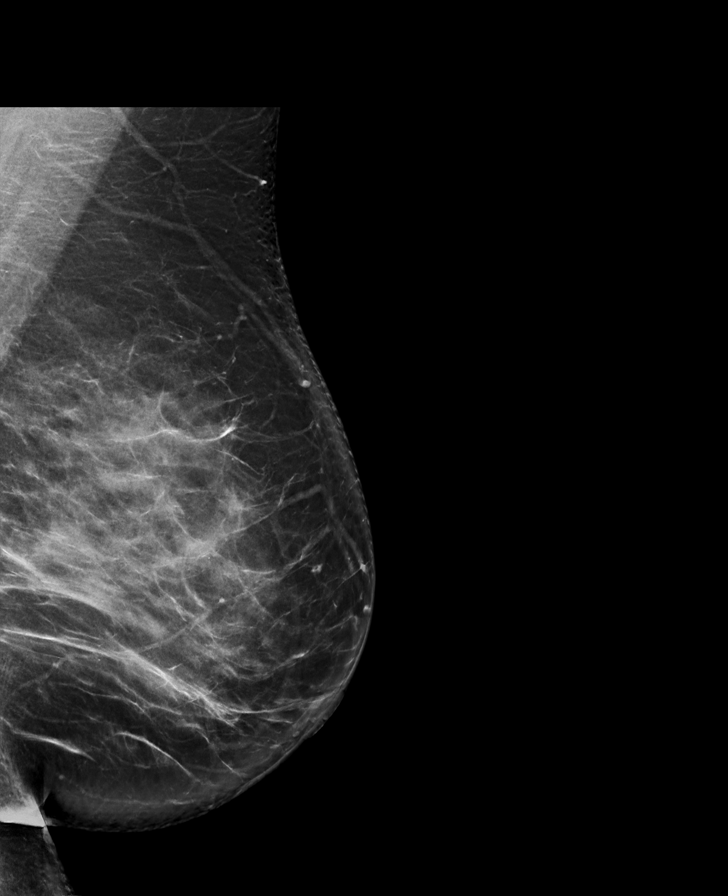

[L CC tomo · tomo slice 45/90.0]
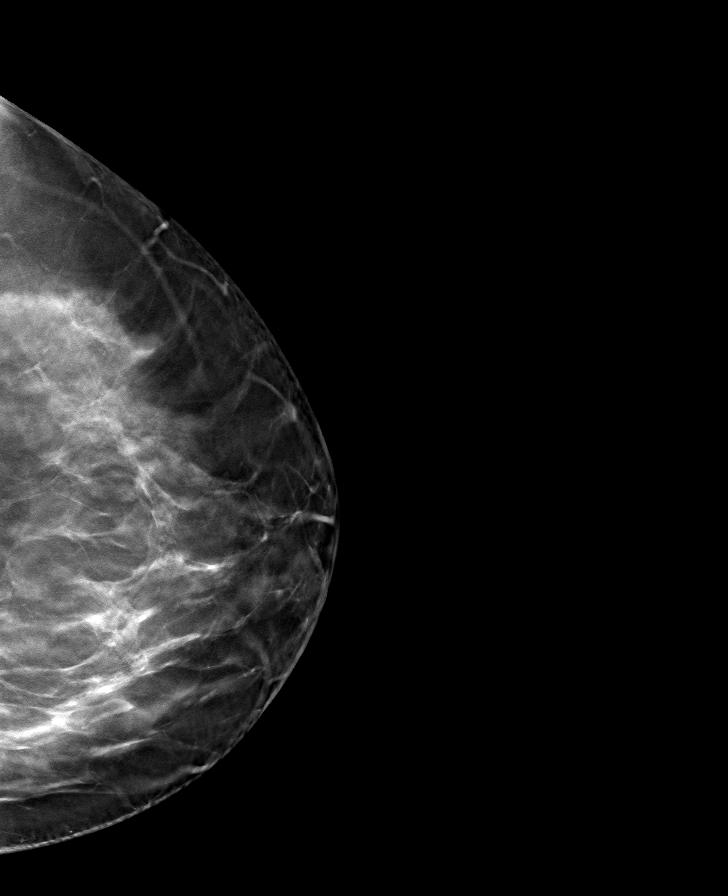

[R MLO tomo · tomo slice 48/95.0]
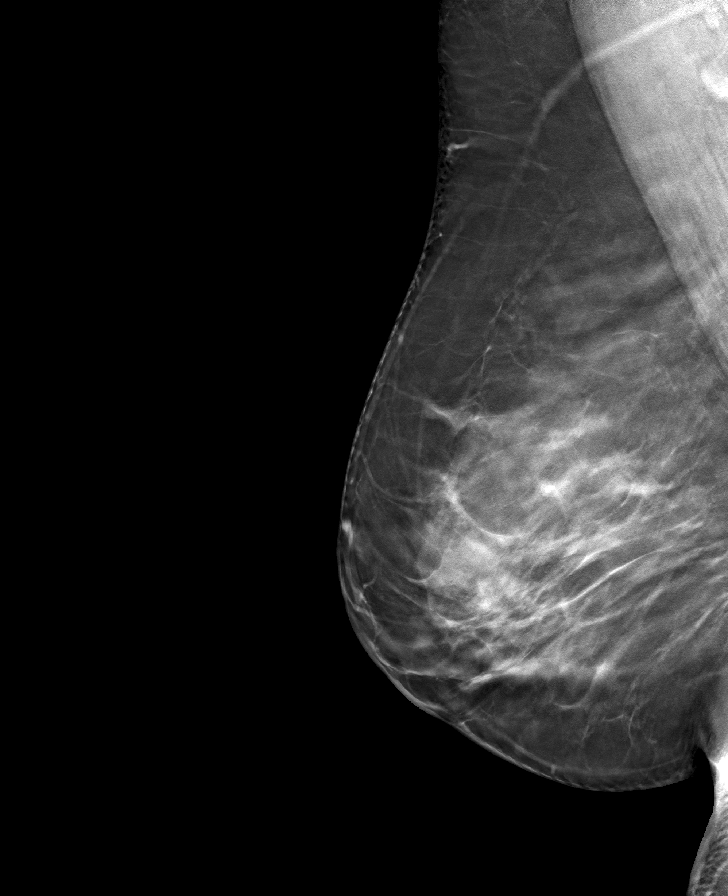

[R CC tomo · tomo slice 45/89.0]
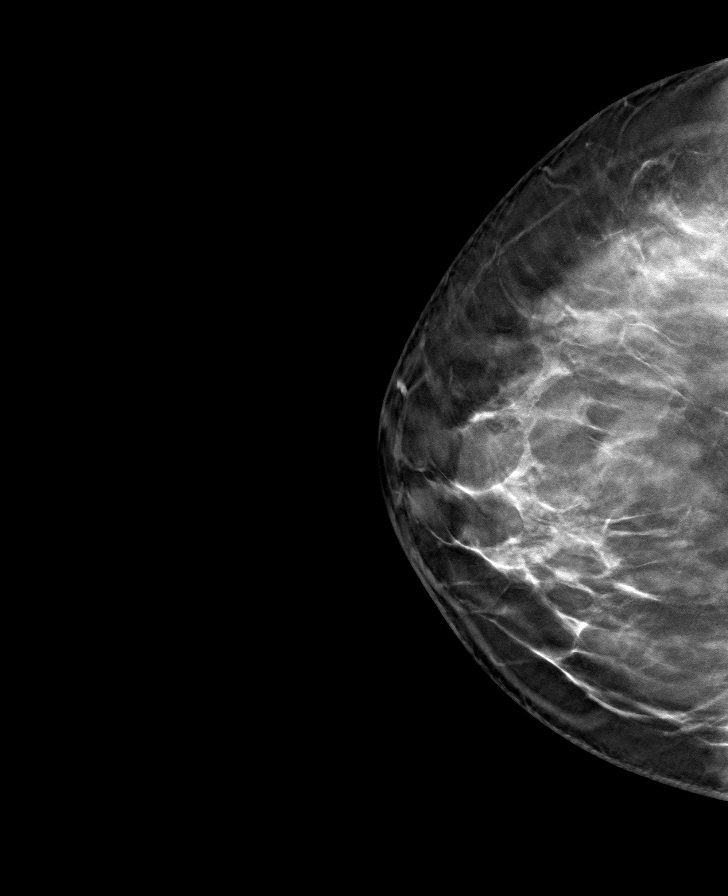

[L MLO tomo · tomo slice 48/95.0]
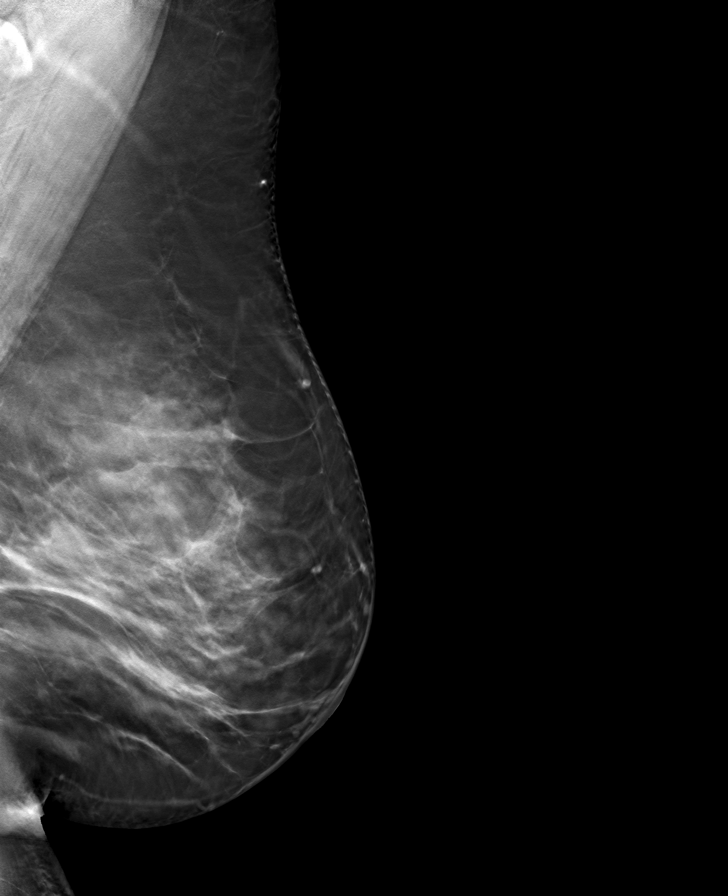

[8 of 24 positions shown; findings below may reference images not displayed]

ACR Breast Density Category c: The breast tissue is heterogeneously
dense, which may obscure small masses.
FINDINGS: There are no findings suspicious for malignancy.
IMPRESSION: No mammographic evidence of malignancy. A result letter of this
screening mammogram will be mailed directly to the patient.

RECOMMENDATION:
Screening mammogram in one year. (Code:Q3-W-BC3)

BI-RADS CATEGORY  1: Negative.

## 2022-08-22 ENCOUNTER — Ambulatory Visit: Payer: 59 | Admitting: Obstetrics & Gynecology
